# Patient Record
Sex: Female | Born: 1965 | Hispanic: Yes | Marital: Married | Smoking: Never smoker
Health system: Southern US, Community
[De-identification: ages and names within clinical notes are randomized; demographics above are authoritative.]

## PROBLEM LIST (undated history)

## (undated) DIAGNOSIS — I2542 Coronary artery dissection: Secondary | ICD-10-CM

## (undated) DIAGNOSIS — N920 Excessive and frequent menstruation with regular cycle: Secondary | ICD-10-CM

## (undated) DIAGNOSIS — N946 Dysmenorrhea, unspecified: Secondary | ICD-10-CM

## (undated) DIAGNOSIS — E785 Hyperlipidemia, unspecified: Secondary | ICD-10-CM

## (undated) DIAGNOSIS — I5189 Other ill-defined heart diseases: Secondary | ICD-10-CM

## (undated) DIAGNOSIS — E119 Type 2 diabetes mellitus without complications: Secondary | ICD-10-CM

## (undated) HISTORY — DX: Coronary artery dissection: I25.42

## (undated) HISTORY — DX: Other ill-defined heart diseases: I51.89

## (undated) HISTORY — DX: Hyperlipidemia, unspecified: E78.5

## (undated) HISTORY — PX: HERNIA REPAIR: SHX51

## (undated) HISTORY — PX: CHOLECYSTECTOMY: SHX55

## (undated) HISTORY — PX: ABDOMINAL HYSTERECTOMY: SHX81

## (undated) HISTORY — DX: Type 2 diabetes mellitus without complications: E11.9

---

## 2013-02-27 DIAGNOSIS — D649 Anemia, unspecified: Secondary | ICD-10-CM | POA: Insufficient documentation

## 2013-07-29 DIAGNOSIS — K2960 Other gastritis without bleeding: Secondary | ICD-10-CM | POA: Insufficient documentation

## 2014-04-13 ENCOUNTER — Observation Stay: Payer: Self-pay | Admitting: Surgery

## 2014-04-13 LAB — COMPREHENSIVE METABOLIC PANEL
Albumin: 3.6 g/dL (ref 3.4–5.0)
Alkaline Phosphatase: 95 U/L
Anion Gap: 9 (ref 7–16)
BUN: 10 mg/dL (ref 7–18)
Bilirubin,Total: 1 mg/dL (ref 0.2–1.0)
CALCIUM: 9 mg/dL (ref 8.5–10.1)
Chloride: 106 mmol/L (ref 98–107)
Co2: 22 mmol/L (ref 21–32)
Creatinine: 0.75 mg/dL (ref 0.60–1.30)
EGFR (Non-African Amer.): >60
GLUCOSE: 141 mg/dL — AB (ref 65–99)
OSMOLALITY: 275 (ref 275–301)
POTASSIUM: 3.3 mmol/L — AB (ref 3.5–5.1)
SGOT(AST): 65 U/L — ABNORMAL HIGH (ref 15–37)
SGPT (ALT): 41 U/L (ref 12–78)
Sodium: 137 mmol/L (ref 136–145)
Total Protein: 7.8 g/dL (ref 6.4–8.2)

## 2014-04-13 LAB — CBC WITH DIFFERENTIAL/PLATELET
BASOS ABS: 0 10*3/uL (ref 0.0–0.1)
Basophil %: 0.2 %
EOS PCT: 0.5 %
Eosinophil #: 0.1 10*3/uL (ref 0.0–0.7)
HCT: 44.3 % (ref 35.0–47.0)
HGB: 14.7 g/dL (ref 12.0–16.0)
LYMPHS ABS: 2.3 10*3/uL (ref 1.0–3.6)
Lymphocyte %: 13.8 %
MCH: 30 pg (ref 26.0–34.0)
MCHC: 33.1 g/dL (ref 32.0–36.0)
MCV: 91 fL (ref 80–100)
MONOS PCT: 6.9 %
Monocyte #: 1.2 x10 3/mm — ABNORMAL HIGH (ref 0.2–0.9)
NEUTROS PCT: 78.6 %
Neutrophil #: 13.3 10*3/uL — ABNORMAL HIGH (ref 1.4–6.5)
PLATELETS: 263 10*3/uL (ref 150–440)
RBC: 4.88 10*6/uL (ref 3.80–5.20)
RDW: 13.5 % (ref 11.5–14.5)
WBC: 16.9 10*3/uL — AB (ref 3.6–11.0)

## 2014-04-13 LAB — URINALYSIS, COMPLETE
Bacteria: NONE SEEN
Bilirubin,UR: NEGATIVE
Blood: NEGATIVE
Glucose,UR: NEGATIVE mg/dL (ref 0–75)
Ketone: NEGATIVE
Leukocyte Esterase: NEGATIVE
Nitrite: NEGATIVE
Ph: 6 (ref 4.5–8.0)
Protein: NEGATIVE
RBC,UR: 2 /HPF (ref 0–5)
SQUAMOUS EPITHELIAL: NONE SEEN
Specific Gravity: 1.012 (ref 1.003–1.030)

## 2014-04-13 LAB — LIPASE, BLOOD: LIPASE: 108 U/L (ref 73–393)

## 2014-04-14 LAB — CBC WITH DIFFERENTIAL/PLATELET
BASOS ABS: 0 10*3/uL (ref 0.0–0.1)
BASOS PCT: 0.3 %
EOS PCT: 2 %
Eosinophil #: 0.2 10*3/uL (ref 0.0–0.7)
HCT: 37.6 % (ref 35.0–47.0)
HGB: 12.3 g/dL (ref 12.0–16.0)
LYMPHS ABS: 2.2 10*3/uL (ref 1.0–3.6)
Lymphocyte %: 27 %
MCH: 30.1 pg (ref 26.0–34.0)
MCHC: 32.7 g/dL (ref 32.0–36.0)
MCV: 92 fL (ref 80–100)
Monocyte #: 0.7 x10 3/mm (ref 0.2–0.9)
Monocyte %: 8.5 %
NEUTROS PCT: 62.2 %
Neutrophil #: 5 10*3/uL (ref 1.4–6.5)
Platelet: 226 10*3/uL (ref 150–440)
RBC: 4.09 10*6/uL (ref 3.80–5.20)
RDW: 13.5 % (ref 11.5–14.5)
WBC: 8 10*3/uL (ref 3.6–11.0)

## 2014-04-14 LAB — COMPREHENSIVE METABOLIC PANEL
ALT: 221 U/L — AB (ref 12–78)
ANION GAP: 11 (ref 7–16)
Albumin: 2.8 g/dL — ABNORMAL LOW (ref 3.4–5.0)
Alkaline Phosphatase: 101 U/L
BUN: 10 mg/dL (ref 7–18)
Bilirubin,Total: 0.7 mg/dL (ref 0.2–1.0)
CALCIUM: 8.2 mg/dL — AB (ref 8.5–10.1)
CO2: 25 mmol/L (ref 21–32)
Chloride: 107 mmol/L (ref 98–107)
Creatinine: 0.75 mg/dL (ref 0.60–1.30)
GLUCOSE: 95 mg/dL (ref 65–99)
Osmolality: 284 (ref 275–301)
POTASSIUM: 3.5 mmol/L (ref 3.5–5.1)
SGOT(AST): 159 U/L — ABNORMAL HIGH (ref 15–37)
SODIUM: 143 mmol/L (ref 136–145)
Total Protein: 6.2 g/dL — ABNORMAL LOW (ref 6.4–8.2)

## 2014-04-16 LAB — PATHOLOGY REPORT

## 2014-06-16 ENCOUNTER — Ambulatory Visit: Payer: Self-pay | Admitting: Surgery

## 2014-06-16 LAB — LIPASE, BLOOD: LIPASE: 88 U/L (ref 73–393)

## 2014-06-16 LAB — HEPATIC FUNCTION PANEL A (ARMC)
AST: 10 U/L — AB (ref 15–37)
Albumin: 3.7 g/dL (ref 3.4–5.0)
Alkaline Phosphatase: 89 U/L
BILIRUBIN DIRECT: 0.1 mg/dL (ref 0.00–0.20)
Bilirubin,Total: 0.6 mg/dL (ref 0.2–1.0)
SGPT (ALT): 14 U/L
Total Protein: 7.6 g/dL (ref 6.4–8.2)

## 2014-07-12 ENCOUNTER — Emergency Department: Payer: Self-pay | Admitting: Emergency Medicine

## 2015-01-29 NOTE — H&P (Signed)
   Subjective/Chief Complaint RUQ pain, nausea/vomiting   History of Present Illness [name] is a pleasant 49 yo F with a history of hysterectomy who presents with 1 day of RUQ pain followed by nausea/vomiting.  Began acutely, comes in waves.  Better now after receiving pain meds.  Also with intermittent diarrhea.  Has had multiple episodes which did not last as long over the past 6 months.  No fevers/chills.   Past History S/p hysterectomy   Family and Social History:  Family History Coronary Artery Disease  Hypertension  Diabetes Mellitus   Social History negative tobacco   + Tobacco Current (within 1 year)   Place of Living Home   Review of Systems:  Subjective/Chief Complaint RUQ pain, nausea/vomiting   Fever/Chills No   Cough No   Sputum No   Abdominal Pain Yes   Diarrhea Yes   Constipation No   Nausea/Vomiting Yes   SOB/DOE No   Chest Pain No   Dysuria No   Tolerating PT No   Tolerating Diet Nauseated  Vomiting   Physical Exam:  GEN well developed, well nourished, no acute distress   HEENT pink conjunctivae   RESP normal resp effort  clear BS  no use of accessory muscles   CARD regular rate   ABD positive tenderness  denies Flank Tenderness  no hernia  distended  normal BS   SKIN normal to palpation, No rashes, No ulcers   NEURO cranial nerves intact, negative rigidity, negative tremor, follows commands   PSYCH A+O to time, place, person, good insight    Assessment/Admission Diagnosis 49 yo F with recurrent RUQ pain followed by nausea/vomiting.  + leukocytosis and thickened gb on ultrasound.  Clinical and radiographic cholecystitis   Plan Admit for resuscitation, IV pain meds, Abx.  Plan on lap chole in am.   Electronic Signatures: Jarvis NewcomerLundquist, Burnett Lieber A (MD)  (Signed 07-Jul-15 19:13)  Authored: CHIEF COMPLAINT and HISTORY, FAMILY AND SOCIAL HISTORY, REVIEW OF SYSTEMS, PHYSICAL EXAM, ASSESSMENT AND PLAN   Last Updated: 07-Jul-15 19:13  by Jarvis NewcomerLundquist, Takeila Thayne A (MD)

## 2015-01-29 NOTE — Op Note (Signed)
PATIENT NAME:  Elaine Fernandez, Elaine Fernandez MR#:  [id number] DATE OF BIRTH:  Jul 12, 1966  DATE OF PROCEDURE:  04/14/2014  SURGEON:  Ida Roguehristopher Aliha Diedrich, MD.  PREOPERATIVE DIAGNOSIS: Acute cholecystitis.   POSTOPERATIVE DIAGNOSIS: Acute cholecystitis.   PROCEDURE PERFORMED: Laparoscopic cholecystectomy.   ANESTHESIA: General.   ESTIMATED BLOOD LOSS: Minimal.   SPECIMENS: Gallbladder.   INDICATION FOR SURGERY:  Elaine Fernandez is a 49 year old female with recurrent right upper quadrant pain who presented with leukocytosis and gallbladder wall thickening concerning for cholecystitis. She was brought to the operating room for cholecystectomy.   DETAILS OF PROCEDURE:  Informed consent was obtained. Elaine Fernandez was brought to the operating room suite. She was induced, endotracheal tube was placed, general anesthesia was administered. Her abdomen was then prepped and draped in standard surgical fashion. A timeout was then performed correctly identifying the patient name, operative site and procedure to be performed.   A supraumbilical incision was made and deepened down to the fascia. The fascia was incised. The peritoneum was entered. Two stay sutures were placed through the fasciotomy. A Hasson trocar was placed through the fasciotomy. The abdomen was insufflated.  The gallbladder was examined. It was noted to be tight and an 11-mm epigastric and two 5-mm right subcostal trocars were placed at the midclavicular and anterior axillary lines. The gallbladder was aspirated with an ovarian cyst aspirator so that it could be grasped.  It was then lifted over the dome of the liver. The cystic artery and cystic duct were dissected out. The critical view was obtained. These structures were clipped and ligated. The gallbladder was then taken off the gallbladder fossa with cautery. The gallbladder was then removed in an Endo Catch bag through the supraumbilical trocar site.   The gallbladder fossa was  then cauterized for hemostasis. Once hemostasis was obtained the trocars were removed under direct visualization. The abdomen was desufflated. The previously-placed stay sutures were used to close the supraumbilical fasciotomy. The wounds were infiltrated with 1% lidocaine with epinephrine and were closed with interrupted 4-0 Monocryl deep dermal sutures. The patient was then awakened, extubated and brought to the postanesthesia care unit. There were no immediate complications. Needle, sponge, and instrument counts were correct at the end of the procedure.    ____________________________ Si Raiderhristopher A. Edan Juday, MD cal:lt D: 04/14/2014 10:44:27 ET T: 04/14/2014 13:47:15 ET JOB#: [id number]  cc: Cristal Deerhristopher A. Pearlie Lafosse, MD, <Dictator> Jarvis NewcomerHRISTOPHER A Jamecia Lerman MD ELECTRONICALLY SIGNED 04/18/2014 18:14

## 2017-08-13 ENCOUNTER — Emergency Department: Payer: Self-pay

## 2017-08-13 ENCOUNTER — Emergency Department
Admission: EM | Admit: 2017-08-13 | Discharge: 2017-08-13 | Disposition: A | Discharge status: Home / Self Care | Payer: Self-pay | Attending: Emergency Medicine | Admitting: Emergency Medicine

## 2017-08-13 ENCOUNTER — Encounter: Payer: Self-pay | Admitting: Emergency Medicine

## 2017-08-13 ENCOUNTER — Other Ambulatory Visit: Payer: Self-pay

## 2017-08-13 DIAGNOSIS — F4321 Adjustment disorder with depressed mood: Secondary | ICD-10-CM | POA: Insufficient documentation

## 2017-08-13 DIAGNOSIS — R079 Chest pain, unspecified: Secondary | ICD-10-CM | POA: Insufficient documentation

## 2017-08-13 LAB — CBC
HEMATOCRIT: 40.6 % (ref 35.0–47.0)
Hemoglobin: 14.2 g/dL (ref 12.0–16.0)
MCH: 32.4 pg (ref 26.0–34.0)
MCHC: 34.9 g/dL (ref 32.0–36.0)
MCV: 92.9 fL (ref 80.0–100.0)
PLATELETS: 249 10*3/uL (ref 150–440)
RBC: 4.36 MIL/uL (ref 3.80–5.20)
RDW: 12.7 % (ref 11.5–14.5)
WBC: 10.9 10*3/uL (ref 3.6–11.0)

## 2017-08-13 LAB — BASIC METABOLIC PANEL
Anion gap: 7 (ref 5–15)
BUN: 11 mg/dL (ref 6–20)
CHLORIDE: 105 mmol/L (ref 101–111)
CO2: 23 mmol/L (ref 22–32)
CREATININE: 0.69 mg/dL (ref 0.44–1.00)
Calcium: 9.3 mg/dL (ref 8.9–10.3)
Glucose, Bld: 134 mg/dL — ABNORMAL HIGH (ref 65–99)
POTASSIUM: 3.6 mmol/L (ref 3.5–5.1)
SODIUM: 135 mmol/L (ref 135–145)

## 2017-08-13 LAB — TROPONIN I: Troponin I: <0.03 ng/mL (ref <0.03)

## 2017-08-13 MED ORDER — LORAZEPAM 1 MG PO TABS: 1.0000 mg | ORAL_TABLET | Freq: Three times a day (TID) | ORAL | 0 refills | Status: DC | PRN

## 2017-08-13 MED ORDER — LORAZEPAM 1 MG PO TABS
1.0000 mg | ORAL_TABLET | Freq: Once | ORAL | Status: AC
  Administered 2017-08-13: 1 mg via ORAL
  Filled 2017-08-13: qty 1

## 2017-08-13 NOTE — ED Triage Notes (Signed)
Patient to ER for c/o chest pain that began this afternoon. Patient states she found out today that her mother passed away. States she felt mildly short of breath, as well as pain down left arm.

## 2017-08-13 NOTE — ED Provider Notes (Signed)
[facility] Emergency Department Provider Note   ____________________________________________   First MD Initiated Contact with Patient 08/13/17 0345     (approximate)  I have reviewed the triage vital signs and the nursing notes.   HISTORY  Chief Complaint Chest Pain    HPI Elaine Fernandez is a 51 y.o. female who comes into the hospital today with some chest pain. The patient heard some news yesterday morning that her mother passed away. Her mother was sick on Sunday and declined rapidly and past Monday morning. The patient states that her pain started around 3 PM. She had some pain in the middle of her chest that went down her left arm. The pain was much worse earlier but is improved right now. The patient had some initial shortness of breath and nausea. She reports that every time she tried to eat she's felt nauseous and hasn't eaten all day. The patient had some tingling in her leg on the left as well. The patient has not had any vomiting. She tried taking some Tylenol and ibuprofen which she typically takes for her knee but states that it didn't help. She received an injection in her knee last week for arthritis. The patient has just felt really shaky. She's had no sweats and no dizziness. She was concerned about the symptoms that she decided to come in and get checked out.   Past Medical History:  Diagnosis Date  . Hyperlipidemia     There are no active problems to display for this patient.   Past Surgical History:  Procedure Laterality Date  . ABDOMINAL HYSTERECTOMY    . CHOLECYSTECTOMY    . HERNIA REPAIR      Prior to Admission medications   Medication Sig Start Date End Date Taking? Authorizing Provider  LORazepam (ATIVAN) 1 MG tablet Take 1 tablet (1 mg total) every 8 (eight) hours as needed by mouth for anxiety. 08/13/17 08/13/18  Rebecka ApleyWebster, Allison P, MD    Allergies Patient has no known allergies.  No family history on  file.  Social History Social History   Tobacco Use  . Smoking status: Never Smoker  . Smokeless tobacco: Never Used  Substance Use Topics  . Alcohol use: No    Frequency: Never  . Drug use: Not on file    Review of Systems  Constitutional: Shaking Eyes: No visual changes. ENT: No sore throat. Cardiovascular:  chest pain. Respiratory: shortness of breath. Gastrointestinal: Nausea with No abdominal pain.  no vomiting.  No diarrhea.  No constipation. Genitourinary: Negative for dysuria. Musculoskeletal: Negative for back pain. Skin: Negative for rash. Neurological: Negative for headaches, focal weakness or numbness.   ____________________________________________   PHYSICAL EXAM:  VITAL SIGNS: ED Triage Vitals  Enc Vitals Group     BP 08/13/17 0121 139/83     Pulse Rate 08/13/17 0121 79     Resp 08/13/17 0121 20     Temp 08/13/17 0121 98.5 F (36.9 C)     Temp Source 08/13/17 0121 Oral     SpO2 08/13/17 0121 96 %     Weight 08/13/17 0120 193 lb (87.5 kg)     Height 08/13/17 0120 5\' 5"  (1.651 m)     Head Circumference --      Peak Flow --      Pain Score 08/13/17 0116 6     Pain Loc --      Pain Edu? --      Excl. in GC? --  Constitutional: Alert and oriented. Well appearing and in mild distress. Eyes: Conjunctivae are normal. PERRL. EOMI. Head: Atraumatic. Nose: No congestion/rhinnorhea. Mouth/Throat: Mucous membranes are moist.  Oropharynx non-erythematous. Cardiovascular: Normal rate, regular rhythm. Grossly normal heart sounds.  Good peripheral circulation. Respiratory: Normal respiratory effort.  No retractions. Lungs CTAB. Gastrointestinal: Soft and nontender. No distention. Positive bowel sounds Musculoskeletal: No lower extremity tenderness nor edema.   Neurologic:  Normal speech and language.  Skin:  Skin is warm, dry and intact.  Psychiatric: Mood and affect are normal.   ____________________________________________   LABS (all labs ordered  are listed, but only abnormal results are displayed)  Labs Reviewed  BASIC METABOLIC PANEL - Abnormal; Notable for the following components:      Result Value   Glucose, Bld 134 (*)    All other components within normal limits  CBC  TROPONIN I  TROPONIN I   ____________________________________________  EKG  ED ECG REPORT I, Rebecka ApleyWebster,  Allison P, the attending physician, personally viewed and interpreted this ECG.   Date: 08/13/2017  EKG Time: 0120  Rate: 80  Rhythm: normal sinus rhythm  Axis: normal  Intervals:none  ST&T Change: none  ____________________________________________  RADIOLOGY  Dg Chest 2 View  Result Date: 08/13/2017 CLINICAL DATA:  Chest pain EXAM: CHEST  2 VIEW COMPARISON:  None. FINDINGS: The heart size and mediastinal contours are within normal limits. Both lungs are clear. Surgical clips in the right upper quadrant. IMPRESSION: No active cardiopulmonary disease. Electronically Signed   By: Jasmine PangKim  Fujinaga M.D.   On: 08/13/2017 01:38    ____________________________________________   PROCEDURES  Procedure(s) performed: None  Procedures  Critical Care performed: No  ____________________________________________   INITIAL IMPRESSION / ASSESSMENT AND PLAN / ED COURSE  As part of my medical decision making, I reviewed the following data within the electronic MEDICAL RECORD NUMBER Notes from prior ED visits and [place] Controlled Substance Database   This is a 51 year old female who comes into the hospital today with some chest pain.  My differential diagnosis includes acute coronary syndrome, pneumonia, referred reaction and stress.  We did check some blood work on the patient. The patient's CBC BMP and troponin were all negative. We did repeat the troponin which was also negative. We did give the patient a dose of Ativan and her pain did improve. Her chest x-ray was also negative. I feel that the patient's pain is went to the grief losing her mother. I feel  that the patient to follow-up with cardiology as well as her primary care physician. She otherwise has no other concerns at this time. She'll be discharged home to follow-up.      ____________________________________________   FINAL CLINICAL IMPRESSION(S) / ED DIAGNOSES  Final diagnoses:  Chest pain, unspecified type  Grief      Note:  This document was prepared using Dragon voice recognition software and may include unintentional dictation errors.    Rebecka ApleyWebster, Allison P, MD 08/13/17 [phone]

## 2018-08-05 ENCOUNTER — Inpatient Hospital Stay
Admission: EM | Admit: 2018-08-05 | Discharge: 2018-08-07 | DRG: 280 | Disposition: A | Discharge status: Home / Self Care | Payer: Medicaid Other | Attending: Internal Medicine | Admitting: Internal Medicine

## 2018-08-05 ENCOUNTER — Emergency Department: Payer: Medicaid Other

## 2018-08-05 ENCOUNTER — Other Ambulatory Visit: Payer: Self-pay

## 2018-08-05 ENCOUNTER — Encounter: Payer: Self-pay | Admitting: Emergency Medicine

## 2018-08-05 DIAGNOSIS — E785 Hyperlipidemia, unspecified: Secondary | ICD-10-CM | POA: Diagnosis present

## 2018-08-05 DIAGNOSIS — R079 Chest pain, unspecified: Secondary | ICD-10-CM | POA: Diagnosis present

## 2018-08-05 DIAGNOSIS — E7800 Pure hypercholesterolemia, unspecified: Secondary | ICD-10-CM | POA: Diagnosis present

## 2018-08-05 DIAGNOSIS — Z90710 Acquired absence of both cervix and uterus: Secondary | ICD-10-CM

## 2018-08-05 DIAGNOSIS — I214 Non-ST elevation (NSTEMI) myocardial infarction: Secondary | ICD-10-CM | POA: Diagnosis present

## 2018-08-05 DIAGNOSIS — I2542 Coronary artery dissection: Secondary | ICD-10-CM | POA: Diagnosis present

## 2018-08-05 DIAGNOSIS — Z8249 Family history of ischemic heart disease and other diseases of the circulatory system: Secondary | ICD-10-CM | POA: Diagnosis not present

## 2018-08-05 DIAGNOSIS — I10 Essential (primary) hypertension: Secondary | ICD-10-CM | POA: Diagnosis present

## 2018-08-05 DIAGNOSIS — R0789 Other chest pain: Secondary | ICD-10-CM | POA: Diagnosis present

## 2018-08-05 HISTORY — DX: Excessive and frequent menstruation with regular cycle: N92.0

## 2018-08-05 HISTORY — DX: Dysmenorrhea, unspecified: N94.6

## 2018-08-05 LAB — CK: Total CK: 78 U/L (ref 38–234)

## 2018-08-05 LAB — CBC
HCT: 43.4 % (ref 36.0–46.0)
Hemoglobin: 14.5 g/dL (ref 12.0–15.0)
MCH: 31.6 pg (ref 26.0–34.0)
MCHC: 33.4 g/dL (ref 30.0–36.0)
MCV: 94.6 fL (ref 80.0–100.0)
NRBC: 0 % (ref 0.0–0.2)
Platelets: 261 10*3/uL (ref 150–400)
RBC: 4.59 MIL/uL (ref 3.87–5.11)
RDW: 11.9 % (ref 11.5–15.5)
WBC: 7.3 10*3/uL (ref 4.0–10.5)

## 2018-08-05 LAB — BASIC METABOLIC PANEL
Anion gap: 9 (ref 5–15)
BUN: 12 mg/dL (ref 6–20)
CHLORIDE: 106 mmol/L (ref 98–111)
CO2: 25 mmol/L (ref 22–32)
CREATININE: 0.75 mg/dL (ref 0.44–1.00)
Calcium: 9.3 mg/dL (ref 8.9–10.3)
GFR calc non Af Amer: >60 mL/min (ref >60)
GLUCOSE: 116 mg/dL — AB (ref 70–99)
Potassium: 3.7 mmol/L (ref 3.5–5.1)
Sodium: 140 mmol/L (ref 135–145)

## 2018-08-05 LAB — TROPONIN I
TROPONIN I: 0.21 ng/mL — AB (ref <0.03)
Troponin I: 0.04 ng/mL (ref <0.03)

## 2018-08-05 MED ORDER — ASPIRIN 81 MG PO CHEW
324.0000 mg | CHEWABLE_TABLET | Freq: Once | ORAL | Status: AC
  Administered 2018-08-05: 324 mg via ORAL
  Filled 2018-08-05: qty 4

## 2018-08-05 MED ORDER — ACETAMINOPHEN 650 MG RE SUPP: 650.0000 mg | Freq: Four times a day (QID) | RECTAL | Status: DC | PRN

## 2018-08-05 MED ORDER — ASPIRIN 325 MG PO TABS
325.0000 mg | ORAL_TABLET | Freq: Every day | ORAL | Status: DC
  Filled 2018-08-05: qty 1

## 2018-08-05 MED ORDER — HEPARIN SODIUM (PORCINE) 5000 UNIT/ML IJ SOLN: 5000.0000 [IU] | Freq: Three times a day (TID) | INTRAMUSCULAR | Status: DC

## 2018-08-05 MED ORDER — TRAZODONE HCL 50 MG PO TABS: 25.0000 mg | ORAL_TABLET | Freq: Every evening | ORAL | Status: DC | PRN

## 2018-08-05 MED ORDER — HYDROCODONE-ACETAMINOPHEN 5-325 MG PO TABS
1.0000 | ORAL_TABLET | ORAL | Status: DC | PRN
  Administered 2018-08-05: 1 via ORAL
  Filled 2018-08-05: qty 1

## 2018-08-05 MED ORDER — ACETAMINOPHEN 325 MG PO TABS
650.0000 mg | ORAL_TABLET | Freq: Four times a day (QID) | ORAL | Status: DC | PRN
  Administered 2018-08-06: 650 mg via ORAL

## 2018-08-05 MED ORDER — ONDANSETRON HCL 4 MG/2ML IJ SOLN: 4.0000 mg | Freq: Four times a day (QID) | INTRAMUSCULAR | Status: DC | PRN

## 2018-08-05 MED ORDER — LORAZEPAM 2 MG/ML IJ SOLN: 1.0000 mg | Freq: Every evening | INTRAMUSCULAR | Status: DC | PRN

## 2018-08-05 MED ORDER — SODIUM CHLORIDE 0.9 % IV SOLN
Freq: Once | INTRAVENOUS | Status: AC
  Administered 2018-08-05: via INTRAVENOUS

## 2018-08-05 MED ORDER — BISACODYL 5 MG PO TBEC: 5.0000 mg | DELAYED_RELEASE_TABLET | Freq: Every day | ORAL | Status: DC | PRN

## 2018-08-05 MED ORDER — DOCUSATE SODIUM 100 MG PO CAPS
100.0000 mg | ORAL_CAPSULE | Freq: Two times a day (BID) | ORAL | Status: DC
  Administered 2018-08-06 – 2018-08-07 (×2): 100 mg via ORAL
  Filled 2018-08-05 (×2): qty 1

## 2018-08-05 MED ORDER — ONDANSETRON HCL 4 MG PO TABS: 4.0000 mg | ORAL_TABLET | Freq: Four times a day (QID) | ORAL | Status: DC | PRN

## 2018-08-05 MED ORDER — NITROGLYCERIN 0.4 MG SL SUBL
0.4000 mg | SUBLINGUAL_TABLET | SUBLINGUAL | Status: DC | PRN
  Administered 2018-08-05: 0.4 mg via SUBLINGUAL
  Filled 2018-08-05: qty 1

## 2018-08-05 NOTE — ED Notes (Signed)
Scanner broken in room 16.  IT was called earlier and still not fixed scanner.  meds given to pt.  Interpreter with pt   Family with pt.

## 2018-08-05 NOTE — ED Notes (Signed)
Report off to kala rn 

## 2018-08-05 NOTE — ED Provider Notes (Addendum)
Patient seen by the hospitalist, Dr. Tildon Husky.  Dr. Tildon Husky reports that she is able to easily re-elicit the patient's pain by palpation across the chest since been associated with moving heavy boxes.  Present time the patient does report that her pain is improved and resting comfortably with pain-free status.  Hospitalist service and recommends rechecking a troponin, if no elevation they would advise discharge and follow-up with cardiology tomorrow and would not admit unless troponin elevates.   Ongoing care assigned to Dr. Cyril Loosen, follow-up on repeat troponin and CK testing, patient remains pain-free without elevation of troponin on recheck would consider discharge for outpatient follow-up tomorrow with cardiology.   Sharyn Creamer, MD 08/05/18 2027    Trop now 0.21, discussed with Dr. Caryn Bee, admission requested by me to hospital for further work-up.     Sharyn Creamer, MD 08/05/18 2141
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
## 2018-08-05 NOTE — ED Notes (Signed)
Date and time results received: 08/05/18 2250 (use smartphrase ".now" to insert current time)  Test: Troponin Critical Value: 0.21  Name of Provider Notified Dr. Fanny Bien  Orders Received? Or Actions Taken?:
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
## 2018-08-05 NOTE — ED Notes (Signed)
Date and time results received: 08/05/18 5:52 PM  (use smartphrase ".now" to insert current time)  Test: troponin 0.04 Critical Value: Name of Provider Notified: Dr. Mayford Knife

## 2018-08-05 NOTE — ED Notes (Signed)
Report to Amy, RN
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
## 2018-08-05 NOTE — H&P (Addendum)
[facility] [place] [facility]   PATIENT NAME: Elaine Fernandez    MR#:  [id number]  DATE OF BIRTH:  1966-04-26  DATE OF ADMISSION:  08/05/2018  PRIMARY CARE PHYSICIAN: [facility]   REQUESTING/REFERRING PHYSICIAN:   CHIEF COMPLAINT:   Chief Complaint  Patient presents with  . Hypertension    HISTORY OF PRESENT ILLNESS: Elaine Fernandez  is a 52 y.o. female without significant past medical history. She presented to emergency room for acute onset of chest pain episode, started around 2 PM, while at rest. She describes the pain as  retrosternal ache, 5 out of 10 in severity, reproducible and radiating to the left arm.  Patient has been moving heavy boxes in the past several days due to moving to a new residence.  Her chest pain started right after she received in use about her dog being involved in an accident.  No similar episodes in the past.  No fever, no chills, no cough, no palpitations, no nausea/vomiting, no bleeding.  She does not take any medications and she denies smoking or using alcohol or illicit drugs. Chest pain is currently resolved, status post nitroglycerin, received in the emergency room. Patient has been physically active in the past 6 months, she does take power walks at least 3 times a week in the park, without any chest pain.  She has changed her diet to more veggies and fruits rather than meat products and fatty foods. With these changes, she was able to lose 30 pounds in the past 6 months. Blood test done emergency room, including CBC and CMP are grossly unremarkable.  First troponin level was borderline elevated at 0.04 and second troponin level went up to 0.21. EKG shows normal sinus rhythm with heart rate of 62 bpm, no acute ischemic changes. Chest x-ray is unremarkable. She is admitted for further evaluation and treatment.  PAST MEDICAL HISTORY:  History reviewed. No pertinent past medical  history.  PAST SURGICAL HISTORY:  Past Surgical History:  Procedure Laterality Date  . ABDOMINAL HYSTERECTOMY    . CHOLECYSTECTOMY    . HERNIA REPAIR      SOCIAL HISTORY:  Social History   Tobacco Use  . Smoking status: Never Smoker  . Smokeless tobacco: Never Used  Substance Use Topics  . Alcohol use: No    Frequency: Never    FAMILY HISTORY: Coronary artery disease in mother, diagnosed at age 18.  DRUG ALLERGIES: No Known Allergies  REVIEW OF SYSTEMS:   CONSTITUTIONAL: No fever, fatigue or weakness.  EYES: No changes in vision.  EARS, NOSE, AND THROAT: No tinnitus or ear pain.  RESPIRATORY: No cough, shortness of breath, wheezing or hemoptysis.  CARDIOVASCULAR: Positive for chest pain, no orthopnea, edema.  GASTROINTESTINAL: No nausea, vomiting, diarrhea or abdominal pain.  GENITOURINARY: No dysuria, hematuria.  ENDOCRINE: No polyuria, nocturia. HEMATOLOGY: No bleeding. SKIN: No rash or lesion. MUSCULOSKELETAL: No joint pain at this time.   NEUROLOGIC: No focal weakness.  PSYCHIATRY: No anxiety or depression.   MEDICATIONS AT HOME:  Prior to Admission medications   Medication Sig Start Date End Date Taking? Authorizing Provider  ibuprofen (ADVIL,MOTRIN) 200 MG tablet Take 400-800 mg by mouth every 6 (six) hours as needed for pain.   Yes [provider]      PHYSICAL EXAMINATION:   VITAL SIGNS: Blood pressure (!) 144/84, pulse (!) 57, temperature 98.6 F (37 C), temperature source Oral, resp. rate 14, height 5\' 5"  (1.651 m),  weight 76.2 kg, SpO2 99 %.  GENERAL:  52 y.o.-year-old patient lying in the bed with no acute distress.  EYES: Pupils equal, round, reactive to light and accommodation. No scleral icterus. Extraocular muscles intact.  HEENT: Head atraumatic, normocephalic. Oropharynx and nasopharynx clear.  NECK:  Supple, no jugular venous distention. No thyroid enlargement, no tenderness.  LUNGS: Normal breath sounds bilaterally, no wheezing,  rales,rhonchi or crepitation. No use of accessory muscles of respiration.  CARDIOVASCULAR: S1, S2 normal. No S3/S4.  ABDOMEN: Soft, nontender, nondistended. Bowel sounds present. No organomegaly or mass.  EXTREMITIES: No pedal edema, cyanosis, or clubbing.  NEUROLOGIC: Cranial nerves II through XII are intact. Muscle strength 5/5 in all extremities. Sensation intact.   PSYCHIATRIC: The patient is alert and oriented x 3.  SKIN: No obvious rash, lesion, or ulcer.   LABORATORY PANEL:   CBC Recent Labs  Lab 08/05/18 1653  WBC 7.3  HGB 14.5  HCT 43.4  PLT 261  MCV 94.6  MCH 31.6  MCHC 33.4  RDW 11.9   ------------------------------------------------------------------------------------------------------------------  Chemistries  Recent Labs  Lab 08/05/18 1653  NA 140  K 3.7  CL 106  CO2 25  GLUCOSE 116*  BUN 12  CREATININE 0.75  CALCIUM 9.3   ------------------------------------------------------------------------------------------------------------------ estimated creatinine clearance is 85.9 mL/min (by C-G formula based on SCr of 0.75 mg/dL). ------------------------------------------------------------------------------------------------------------------ No results for input(s): TSH, T4TOTAL, T3FREE, THYROIDAB in the last 72 hours.  Invalid input(s): FREET3   Coagulation profile No results for input(s): INR, PROTIME in the last 168 hours. ------------------------------------------------------------------------------------------------------------------- No results for input(s): DDIMER in the last 72 hours. -------------------------------------------------------------------------------------------------------------------  Cardiac Enzymes Recent Labs  Lab 08/05/18 1653 08/05/18 2042  TROPONINI 0.04* 0.21*   ------------------------------------------------------------------------------------------------------------------ Invalid input(s):  POCBNP  ---------------------------------------------------------------------------------------------------------------  Urinalysis    Component Value Date/Time   COLORURINE Yellow 04/13/2014 1900   APPEARANCEUR Clear 04/13/2014 1900   LABSPEC 1.012 04/13/2014 1900   PHURINE 6.0 04/13/2014 1900   GLUCOSEU Negative 04/13/2014 1900   HGBUR Negative 04/13/2014 1900   BILIRUBINUR Negative 04/13/2014 1900   KETONESUR Negative 04/13/2014 1900   PROTEINUR Negative 04/13/2014 1900   NITRITE Negative 04/13/2014 1900   LEUKOCYTESUR Negative 04/13/2014 1900     RADIOLOGY: Dg Chest 2 View  Result Date: 08/05/2018 CLINICAL DATA:  Chest pain, hypertension EXAM: CHEST - 2 VIEW COMPARISON:  Chest x-ray of 08/13/2017 FINDINGS: No active infiltrate or effusion is seen. Mediastinal and hilar contours are unremarkable. The heart is within upper limits of normal. No bony abnormality is seen. IMPRESSION: No active cardiopulmonary disease. Electronically Signed   By: Dwyane Dee M.D.   On: 08/05/2018 17:17    EKG: Orders placed or performed during the hospital encounter of 08/05/18  . ED EKG within 10 minutes  . ED EKG within 10 minutes  . ED EKG  . ED EKG    IMPRESSION AND PLAN:  1.  NSTEMI, will rule out ACS.  Continue aspirin daily and nitroglycerin as needed.  Continue to monitor patient on telemetry and follow troponin levels.  Will check fasting lipid panel, TSH and hemoglobin A1c.  Will check 2D echo to further evaluate cardiac function.  Cardiology is consulted for further evaluation and treatment. 2.  Chest pain, rather atypical from patient's description.  However, will rule out ACS.  All the records are reviewed and case discussed with ED provider. Management plans discussed with the patient, family and they are in agreement.  CODE STATUS: Full    TOTAL TIME TAKING CARE OF THIS  PATIENT: 45 minutes.    Cammy Copa M.D on 08/05/2018 at 10:38 PM  Between 7am to 6pm - Pager -  [phone]  After 6pm go to [email or web address] - password [email or web address] [place] at [facility]  [phone]  CC: Primary care physician; [facility] [employer]

## 2018-08-05 NOTE — Consult Note (Signed)
[facility] [place] at [facility]   PATIENT NAME: Elaine Fernandez    MR#:  [id number]  DATE OF BIRTH:  07-24-1966  DATE OF ADMISSION:  08/05/2018  PRIMARY CARE PHYSICIAN: [facility]   REQUESTING/REFERRING PHYSICIAN: dr Fanny Bien  CHIEF COMPLAINT:   Chest pain HISTORY OF PRESENT ILLNESS:  Elaine Fernandez  is a 52 y.o. female with no past medical history who presents today due to chest pain.  Patient reports approximately 2:00 this afternoon while she was sitting at her house she developed sudden onset of chest pain.  The chest pain radiated to her arm.  Did not have nausea, diaphoresis, palpitations with the chest pain.  On a scale of 1-10 it was a 5 out of 10.  The chest pain has persisted.  Patient has been moving heavy boxes over the past several days because she is moving out of her current house. When she had chest pain she just received news that her dog had been an accident.  She does report that she has had some muscle tightness of her chest since she has been moving.  She also states that her arm has been falling asleep recently when she lays on it.  In the ER her troponin 0 0.04 and EKG shows no acute changes.  On examination patient demonstrates significant chest wall tenderness which is similar to the current pain she is having.  PAST MEDICAL HISTORY:  She has had elevated blood pressure in the past but no formal diagnosis of hypertension  PAST SURGICAL HISTORY:   Past Surgical History:  Procedure Laterality Date  . ABDOMINAL HYSTERECTOMY    . CHOLECYSTECTOMY    . HERNIA REPAIR      SOCIAL HISTORY:   Social History   Tobacco Use  . Smoking status: Never Smoker  . Smokeless tobacco: Never Used  Substance Use Topics  . Alcohol use: No    Frequency: Never    FAMILY HISTORY:  CAD Hypertension  DRUG ALLERGIES:  No Known Allergies  REVIEW OF SYSTEMS:   Review of Systems  Constitutional: Negative.  Negative for  chills, fever and malaise/fatigue.  HENT: Negative.  Negative for ear discharge, ear pain, hearing loss, nosebleeds and sore throat.   Eyes: Negative.  Negative for blurred vision and pain.  Respiratory: Negative.  Negative for cough, hemoptysis, shortness of breath and wheezing.   Cardiovascular: Negative.  Negative for chest pain, palpitations and leg swelling.  Gastrointestinal: Negative.  Negative for abdominal pain, blood in stool, diarrhea, nausea and vomiting.  Genitourinary: Negative.  Negative for dysuria.  Musculoskeletal: Negative.  Negative for back pain.  Skin: Negative.   Neurological: Negative for dizziness, tremors, speech change, focal weakness, seizures and headaches.  Endo/Heme/Allergies: Negative.  Does not bruise/bleed easily.  Psychiatric/Behavioral: Negative.  Negative for depression, hallucinations and suicidal ideas.    MEDICATIONS AT HOME:   None  VITAL SIGNS:  Blood pressure (!) 144/84, pulse 64, temperature 98.6 F (37 C), temperature source Oral, resp. rate 15, height 5\' 5"  (1.651 m), weight 76.2 kg, SpO2 99 %.  PHYSICAL EXAMINATION:   Physical Exam  Constitutional: She is oriented to person, place, and time. No distress.  HENT:  Head: Normocephalic.  Eyes: No scleral icterus.  Neck: Normal range of motion. Neck supple. No JVD present. No tracheal deviation present.  Cardiovascular: Normal rate, regular rhythm and normal heart sounds. Exam reveals no gallop and no friction rub.  No murmur heard. Significant chest wall tenderness which  reproduces current pain  Pulmonary/Chest: Effort normal and breath sounds normal. No respiratory distress. She has no wheezes. She has no rales. She exhibits no tenderness.  Abdominal: Soft. Bowel sounds are normal. She exhibits no distension and no mass. There is no tenderness. There is no rebound and no guarding.  Musculoskeletal: Normal range of motion. She exhibits no edema.  Neurological: She is alert and oriented to  person, place, and time.  Skin: Skin is warm. No rash noted. No erythema.  Psychiatric: Judgment normal.      LABORATORY PANEL:   CBC Recent Labs  Lab 08/05/18 1653  WBC 7.3  HGB 14.5  HCT 43.4  PLT 261   ------------------------------------------------------------------------------------------------------------------  Chemistries  Recent Labs  Lab 08/05/18 1653  NA 140  K 3.7  CL 106  CO2 25  GLUCOSE 116*  BUN 12  CREATININE 0.75  CALCIUM 9.3   ------------------------------------------------------------------------------------------------------------------  Cardiac Enzymes Recent Labs  Lab 08/05/18 1653  TROPONINI 0.04*   ------------------------------------------------------------------------------------------------------------------  RADIOLOGY:  Dg Chest 2 View  Result Date: 08/05/2018 CLINICAL DATA:  Chest pain, hypertension EXAM: CHEST - 2 VIEW COMPARISON:  Chest x-ray of 08/13/2017 FINDINGS: No active infiltrate or effusion is seen. Mediastinal and hilar contours are unremarkable. The heart is within upper limits of normal. No bony abnormality is seen. IMPRESSION: No active cardiopulmonary disease. Electronically Signed   By: Dwyane Dee M.D.   On: 08/05/2018 17:17    EKG:  Sinus rhythm no ST elevation or depression  IMPRESSION AND PLAN:   52 year old female with no past medical history presents with chest pain.  1.  Atypical reproducible chest pain: Plan to check another cardiac enzyme and if this is normal then ED physician will speak with cardiologist on call and arrange outpatient work-up. Her chest pain is more musculoskeletal in nature and she would benefit from some ibuprofen or NSAIDs.   Thank you for allowing me to participate in the care of the patient. All the records are reviewed and case discussed with ED provider. Management plans discussed with the patient and she is in agreement  CODE STATUS: Full  TOTAL TIME TAKING CARE OF  THIS PATIENT: 45 minutes.    Mitsugi Schrader M.D on 08/05/2018 at 8:25 PM  Between 7am to 6pm - Pager - [phone]  After 6pm go to [email or web address] - Social research officer, government  [facility]  Office  [phone]  CC: Primary care physician; [facility] [employer]

## 2018-08-05 NOTE — ED Notes (Signed)
Charge RN, Research scientist (physical sciences) informed that patient is needing to be seen on main part of ER per Dr. Mayford Knife.

## 2018-08-05 NOTE — ED Notes (Signed)
Kala RN, aware of bed assigned  

## 2018-08-06 ENCOUNTER — Encounter
Admission: EM | Disposition: A | Discharge status: Home / Self Care | Payer: Self-pay | Source: Home / Self Care | Attending: Internal Medicine

## 2018-08-06 ENCOUNTER — Encounter: Payer: Self-pay | Admitting: Physician Assistant

## 2018-08-06 ENCOUNTER — Inpatient Hospital Stay (HOSPITAL_COMMUNITY)
Admit: 2018-08-06 | Discharge: 2018-08-06 | Disposition: A | Discharge status: Home / Self Care | Payer: Medicaid Other | Attending: Internal Medicine | Admitting: Internal Medicine

## 2018-08-06 ENCOUNTER — Other Ambulatory Visit: Payer: Self-pay

## 2018-08-06 DIAGNOSIS — I214 Non-ST elevation (NSTEMI) myocardial infarction: Principal | ICD-10-CM

## 2018-08-06 DIAGNOSIS — I5030 Unspecified diastolic (congestive) heart failure: Secondary | ICD-10-CM

## 2018-08-06 HISTORY — PX: LEFT HEART CATH AND CORONARY ANGIOGRAPHY: CATH118249

## 2018-08-06 LAB — CBC
HEMATOCRIT: 39.7 % (ref 36.0–46.0)
Hemoglobin: 13.2 g/dL (ref 12.0–15.0)
MCH: 31.6 pg (ref 26.0–34.0)
MCHC: 33.2 g/dL (ref 30.0–36.0)
MCV: 95 fL (ref 80.0–100.0)
Platelets: 229 10*3/uL (ref 150–400)
RBC: 4.18 MIL/uL (ref 3.87–5.11)
RDW: 11.9 % (ref 11.5–15.5)
WBC: 6.6 10*3/uL (ref 4.0–10.5)
nRBC: 0 % (ref 0.0–0.2)

## 2018-08-06 LAB — LIPID PANEL
CHOL/HDL RATIO: 4.6 ratio
Cholesterol: 185 mg/dL (ref 0–200)
HDL: 40 mg/dL — AB (ref >40)
LDL CALC: 118 mg/dL — AB (ref 0–99)
Triglycerides: 133 mg/dL (ref <150)
VLDL: 27 mg/dL (ref 0–40)

## 2018-08-06 LAB — BASIC METABOLIC PANEL
Anion gap: 8 (ref 5–15)
BUN: 13 mg/dL (ref 6–20)
CHLORIDE: 108 mmol/L (ref 98–111)
CO2: 25 mmol/L (ref 22–32)
CREATININE: 0.63 mg/dL (ref 0.44–1.00)
Calcium: 8.7 mg/dL — ABNORMAL LOW (ref 8.9–10.3)
GFR calc Af Amer: >60 mL/min (ref >60)
GFR calc non Af Amer: >60 mL/min (ref >60)
Glucose, Bld: 102 mg/dL — ABNORMAL HIGH (ref 70–99)
Potassium: 3.7 mmol/L (ref 3.5–5.1)
Sodium: 141 mmol/L (ref 135–145)

## 2018-08-06 LAB — MAGNESIUM: MAGNESIUM: 2.1 mg/dL (ref 1.7–2.4)

## 2018-08-06 LAB — ECHOCARDIOGRAM COMPLETE
Height: 65 in
Weight: 2687.85 oz

## 2018-08-06 LAB — TROPONIN I: Troponin I: 0.22 ng/mL (ref <0.03)

## 2018-08-06 LAB — GLUCOSE, CAPILLARY: GLUCOSE-CAPILLARY: 91 mg/dL (ref 70–99)

## 2018-08-06 LAB — HEMOGLOBIN A1C
HEMOGLOBIN A1C: 5.2 % (ref 4.8–5.6)
MEAN PLASMA GLUCOSE: 102.54 mg/dL

## 2018-08-06 LAB — TSH: TSH: 1.309 u[IU]/mL (ref 0.350–4.500)

## 2018-08-06 SURGERY — LEFT HEART CATH AND CORONARY ANGIOGRAPHY
Anesthesia: Moderate Sedation

## 2018-08-06 MED ORDER — VERAPAMIL HCL 2.5 MG/ML IV SOLN
INTRAVENOUS | Status: DC | PRN
  Administered 2018-08-06 (×2): 2.5 mg via INTRA_ARTERIAL

## 2018-08-06 MED ORDER — HEPARIN SODIUM (PORCINE) 1000 UNIT/ML IJ SOLN
INTRAMUSCULAR | Status: DC | PRN
  Administered 2018-08-06: 4000 [IU] via INTRAVENOUS

## 2018-08-06 MED ORDER — FENTANYL CITRATE (PF) 100 MCG/2ML IJ SOLN
INTRAMUSCULAR | Status: DC | PRN
  Administered 2018-08-06 (×2): 25 ug via INTRAVENOUS

## 2018-08-06 MED ORDER — ASPIRIN 81 MG PO CHEW
CHEWABLE_TABLET | ORAL | Status: AC
  Filled 2018-08-06: qty 1

## 2018-08-06 MED ORDER — SODIUM CHLORIDE 0.9 % IV SOLN: 250.0000 mL | INTRAVENOUS | Status: DC | PRN

## 2018-08-06 MED ORDER — ASPIRIN 81 MG PO CHEW
81.0000 mg | CHEWABLE_TABLET | ORAL | Status: AC
  Administered 2018-08-06: 81 mg via ORAL

## 2018-08-06 MED ORDER — MIDAZOLAM HCL 2 MG/2ML IJ SOLN
INTRAMUSCULAR | Status: DC | PRN
  Administered 2018-08-06: 1 mg via INTRAVENOUS

## 2018-08-06 MED ORDER — HEPARIN (PORCINE) IN NACL 1000-0.9 UT/500ML-% IV SOLN
INTRAVENOUS | Status: AC
  Filled 2018-08-06: qty 1000

## 2018-08-06 MED ORDER — SODIUM CHLORIDE 0.9 % WEIGHT BASED INFUSION
3.0000 mL/kg/h | INTRAVENOUS | Status: DC
  Administered 2018-08-06: 3 mL/kg/h via INTRAVENOUS

## 2018-08-06 MED ORDER — SODIUM CHLORIDE 0.9% FLUSH: 3.0000 mL | Freq: Two times a day (BID) | INTRAVENOUS | Status: DC

## 2018-08-06 MED ORDER — MIDAZOLAM HCL 2 MG/2ML IJ SOLN
INTRAMUSCULAR | Status: AC
  Filled 2018-08-06: qty 2

## 2018-08-06 MED ORDER — SODIUM CHLORIDE 0.9 % WEIGHT BASED INFUSION: 1.0000 mL/kg/h | INTRAVENOUS | Status: DC

## 2018-08-06 MED ORDER — FENTANYL CITRATE (PF) 100 MCG/2ML IJ SOLN
INTRAMUSCULAR | Status: AC
  Filled 2018-08-06: qty 2

## 2018-08-06 MED ORDER — HEPARIN SODIUM (PORCINE) 1000 UNIT/ML IJ SOLN
INTRAMUSCULAR | Status: AC
  Filled 2018-08-06: qty 1

## 2018-08-06 MED ORDER — NITROGLYCERIN 5 MG/ML IV SOLN
INTRAVENOUS | Status: AC
  Filled 2018-08-06: qty 10

## 2018-08-06 MED ORDER — ACETAMINOPHEN 325 MG PO TABS
ORAL_TABLET | ORAL | Status: AC
  Filled 2018-08-06: qty 2

## 2018-08-06 MED ORDER — ASPIRIN 81 MG PO CHEW
81.0000 mg | CHEWABLE_TABLET | Freq: Every day | ORAL | Status: DC
  Administered 2018-08-07: 81 mg via ORAL
  Filled 2018-08-06: qty 1

## 2018-08-06 MED ORDER — SODIUM CHLORIDE 0.9% FLUSH: 3.0000 mL | INTRAVENOUS | Status: DC | PRN

## 2018-08-06 MED ORDER — ATORVASTATIN CALCIUM 20 MG PO TABS
40.0000 mg | ORAL_TABLET | Freq: Every day | ORAL | Status: DC
  Administered 2018-08-06: 40 mg via ORAL
  Filled 2018-08-06: qty 2

## 2018-08-06 MED ORDER — VERAPAMIL HCL 2.5 MG/ML IV SOLN
INTRAVENOUS | Status: AC
  Filled 2018-08-06: qty 2

## 2018-08-06 MED ORDER — IOPAMIDOL (ISOVUE-300) INJECTION 61%
INTRAVENOUS | Status: DC | PRN
  Administered 2018-08-06: 60 mL via INTRA_ARTERIAL

## 2018-08-06 MED ORDER — SODIUM CHLORIDE 0.9% FLUSH
3.0000 mL | Freq: Two times a day (BID) | INTRAVENOUS | Status: DC
  Administered 2018-08-06: 3 mL via INTRAVENOUS

## 2018-08-06 MED ORDER — SODIUM CHLORIDE 0.9 % IV SOLN
INTRAVENOUS | Status: AC
  Administered 2018-08-06: 13:00:00 via INTRAVENOUS

## 2018-08-06 MED ORDER — NITROGLYCERIN 1 MG/10 ML FOR IR/CATH LAB
INTRA_ARTERIAL | Status: DC | PRN
  Administered 2018-08-06: 200 ug via INTRACORONARY

## 2018-08-06 MED ORDER — METOPROLOL TARTRATE 25 MG PO TABS
12.5000 mg | ORAL_TABLET | Freq: Two times a day (BID) | ORAL | Status: DC
  Administered 2018-08-06 – 2018-08-07 (×3): 12.5 mg via ORAL
  Filled 2018-08-06 (×3): qty 1

## 2018-08-06 SURGICAL SUPPLY — 7 items
CATH INFINITI 5FR ANG PIGTAIL (CATHETERS) ×3 IMPLANT
CATH INFINITI 5FR JK (CATHETERS) ×3 IMPLANT
DEVICE RAD TR BAND REGULAR (VASCULAR PRODUCTS) ×3 IMPLANT
GLIDESHEATH SLEND SS 6F .021 (SHEATH) ×3 IMPLANT
KIT MANI 3VAL PERCEP (MISCELLANEOUS) ×3 IMPLANT
PACK CARDIAC CATH (CUSTOM PROCEDURE TRAY) ×3 IMPLANT
WIRE ROSEN-J .035X260CM (WIRE) ×3 IMPLANT

## 2018-08-06 NOTE — Consult Note (Signed)
   Cardiology Consultation:   Patient ID: Elaine Fernandez; [id number]; 12/30/1965   Admit date: 08/05/2018 Date of Consult: 08/06/2018  Primary Care Provider: Inc, [facility] Primary Cardiologist: new to [facility] - consult by End   Patient Profile:   Elaine Fernandez is a 52 y.o. female with a hx of dysmenorrhea with menorrhagia status post hysterectomy who is being seen today for the evaluation of elevated troponin at the request of Dr. Maier.  History of Present Illness:   Elaine Fernandez has no previously known cardiac history.  She reports a 1 to 2-month history of intermittent shortness of breath and left arm paresthesias that will occasionally radiate to the right arm as well as to the bilateral legs.  The symptoms are not associated with exertion.  Patient was in her usual state of health on 10/29 when she opened her front door and her dog ran out into the front yard and into the street.  The dog was hit by a car.  The dog did not die.  In this setting, the patient developed substernal chest pressure that radiated to the left arm with associated left arm paresthesias.  Approximately 30 minutes later, the patient started developed shortness of breath as well as diaphoresis.  No nausea, vomiting, presyncope, or syncope.  Patient [place] the street from a drugstore and went to check her blood pressure.  BP was noted to be in the 140s over low 100s.  Several minutes later BP has trended up into the 160s over 1 teens.  She was advised to contact her PCP.  [facility] contacted [facility] who advised her to go to the ED.  Of note, when patient's mother passed away in 08/2017 patient had similar symptoms upon hearing the news of her mother's passing.  Patient denies any prior history of tobacco abuse, alcohol use, or illegal drug abuse.  She denies any history of diabetes, hypertension, or hyperlipidemia.  There is no family history of premature coronary disease.  Her  mother and father both had MIs at age 70 and 74 respectively.  Upon the patient's arrival to [facility] they were found to have BP into the 160s over low 100s. EKG was nonacute as below, CXR showed no acute cardiopulmonary process. Labs showed troponin 0.04 trending to 0.21 trending to 0.22, CK 78, CBC unremarkable, potassium 3.7, glucose 116, serum creatinine 0.75, TSH normal, LDL 118, A1c pending, magnesium 2.1.  Patient reports her chest pain and left arm paresthesias lasted "all day."  In the ED, she was given ASA 324 mg x 1, Norco, sublingual nitroglycerin x1.  Initially, there were plans to discharge her from the ED however her troponin subsequently trended up to 0.21 as above and she was admitted.  She is currently chest pain-free.  Past Medical History:  Diagnosis Date  . Dysmenorrhea   . Menorrhagia     Past Surgical History:  Procedure Laterality Date  . ABDOMINAL HYSTERECTOMY    . CHOLECYSTECTOMY    . HERNIA REPAIR       Home Meds: Prior to Admission medications   Medication Sig Start Date End Date Taking? Authorizing Provider  ibuprofen (ADVIL,MOTRIN) 200 MG tablet Take 400-800 mg by mouth every 6 (six) hours as needed for pain.   Yes [provider]    Inpatient Medications: Scheduled Meds: . [START ON 08/07/2018] aspirin  81 mg Oral Pre-Cath  . docusate sodium  100 mg Oral BID  . heparin  5,000 Units Subcutaneous   Q8H  . sodium chloride flush  3 mL Intravenous Q12H   Continuous Infusions: . sodium chloride    . [START ON 08/07/2018] sodium chloride     Followed by  . [START ON 08/07/2018] sodium chloride     PRN Meds: sodium chloride, acetaminophen **OR** acetaminophen, bisacodyl, HYDROcodone-acetaminophen, LORazepam, nitroGLYCERIN, ondansetron **OR** ondansetron (ZOFRAN) IV, sodium chloride flush, traZODone  Allergies:  No Known Allergies  Social History:   Social History   Socioeconomic History  . Marital status: Married    Spouse name: Not on file  .  Number of children: Not on file  . Years of education: Not on file  . Highest education level: Not on file  Occupational History  . Not on file  Social Needs  . Financial resource strain: Not on file  . Food insecurity:    Worry: Not on file    Inability: Not on file  . Transportation needs:    Medical: Not on file    Non-medical: Not on file  Tobacco Use  . Smoking status: Never Smoker  . Smokeless tobacco: Never Used  Substance and Sexual Activity  . Alcohol use: No    Frequency: Never  . Drug use: Not on file  . Sexual activity: Not on file  Lifestyle  . Physical activity:    Days per week: Not on file    Minutes per session: Not on file  . Stress: Not on file  Relationships  . Social connections:    Talks on phone: Not on file    Gets together: Not on file    Attends religious service: Not on file    Active member of club or organization: Not on file    Attends meetings of clubs or organizations: Not on file    Relationship status: Not on file  . Intimate partner violence:    Fear of current or ex partner: Not on file    Emotionally abused: Not on file    Physically abused: Not on file    Forced sexual activity: Not on file  Other Topics Concern  . Not on file  Social History Narrative  . Not on file     Family History:   Family History  Problem Relation Age of Onset  . CAD Mother   . Diabetes Mother   . CAD Father   . Diabetes Father     ROS:  Review of Systems  Constitutional: Positive for malaise/fatigue. Negative for chills, diaphoresis, fever and weight loss.  HENT: Negative for congestion.   Eyes: Negative for discharge and redness.  Respiratory: Negative for cough, hemoptysis, sputum production, shortness of breath and wheezing.   Cardiovascular: Positive for chest pain. Negative for palpitations, orthopnea, claudication, leg swelling and PND.  Gastrointestinal: Negative for abdominal pain, blood in stool, heartburn, melena, nausea and vomiting.   Genitourinary: Negative for hematuria.  Musculoskeletal: Negative for falls and myalgias.  Skin: Negative for rash.  Neurological: Positive for tingling, sensory change and weakness. Negative for dizziness, tremors, speech change, focal weakness and loss of consciousness.  Endo/Heme/Allergies: Does not bruise/bleed easily.  Psychiatric/Behavioral: Negative for substance abuse. The patient is nervous/anxious.   All other systems reviewed and are negative.     Physical Exam/Data:   Vitals:   08/05/18 2300 08/05/18 2331 08/06/18 0627 08/06/18 0821  BP: (!) 103/92 (!) 157/86 113/64 (!) 152/87  Pulse: 70 61 (!) 59 63  Resp: 15 18 15   Temp:  97.9 F (36.6 C) 98.4 F (36.9   C) 97.9 F (36.6 C)  TempSrc:  Oral Oral Oral  SpO2: 98% 98% 100% 99%  Weight:      Height:        Intake/Output Summary (Last 24 hours) at 08/06/2018 0900 Last data filed at 08/05/2018 2331 Gross per 24 hour  Intake 0 ml  Output 100 ml  Net -100 ml   Filed Weights   08/05/18 1637  Weight: 76.2 kg   Body mass index is 27.96 kg/m.   Physical Exam: General: Well developed, well nourished, in no acute distress. Head: Normocephalic, atraumatic, sclera non-icteric, no xanthomas, nares without discharge.  Neck: Negative for carotid bruits. JVD not elevated. Lungs: Clear bilaterally to auscultation without wheezes, rales, or rhonchi. Breathing is unlabored. Heart: RRR with S1 S2. No murmurs, rubs, or gallops appreciated. Abdomen: Soft, non-tender, non-distended with normoactive bowel sounds. No hepatomegaly. No rebound/guarding. No obvious abdominal masses. Msk:  Strength and tone appear normal for age. Extremities: No clubbing or cyanosis. No edema. Distal pedal pulses are 2+ and equal bilaterally. Neuro: Alert and oriented X 3. No facial asymmetry. No focal deficit. Moves all extremities spontaneously. Psych:  Responds to questions appropriately with a normal affect.   EKG:  The EKG was personally  reviewed and demonstrates: NSR, 82 bpm, normal axis, nonspecific ST-T changes V2 Telemetry:  Telemetry was personally reviewed and demonstrates: NSR  Weights: Filed Weights   08/05/18 1637  Weight: 76.2 kg    Relevant CV Studies: LHC and echo pending  Laboratory Data:  Chemistry Recent Labs  Lab 08/05/18 1653 08/06/18 0634  NA 140 141  K 3.7 3.7  CL 106 108  CO2 25 25  GLUCOSE 116* 102*  BUN 12 13  CREATININE 0.75 0.63  CALCIUM 9.3 8.7*  GFRNONAA >60 >60  GFRAA >60 >60  ANIONGAP 9 8    No results for input(s): PROT, ALBUMIN, AST, ALT, ALKPHOS, BILITOT in the last 168 hours. Hematology Recent Labs  Lab 08/05/18 1653 08/06/18 0634  WBC 7.3 6.6  RBC 4.59 4.18  HGB 14.5 13.2  HCT 43.4 39.7  MCV 94.6 95.0  MCH 31.6 31.6  MCHC 33.4 33.2  RDW 11.9 11.9  PLT 261 229   Cardiac Enzymes Recent Labs  Lab 08/05/18 1653 08/05/18 2042 08/06/18 0634  TROPONINI 0.04* 0.21* 0.22*   No results for input(s): TROPIPOC in the last 168 hours.  BNPNo results for input(s): BNP, PROBNP in the last 168 hours.  DDimer No results for input(s): DDIMER in the last 168 hours.  Radiology/Studies:  Dg Chest 2 View  Result Date: 08/05/2018 IMPRESSION: No active cardiopulmonary disease. Electronically Signed   By: Paul  Barry M.D.   On: 08/05/2018 17:17    Assessment and Plan:   1.  Non-STEMI: -Currently chest pain-free -Troponin has trended to 0.22, continue to cycle until peak -N.p.o. -Scheduled for LHC with Dr. End this morning -Echo pending -ASA -Check lipid panel and A1c for further risk stratification -Given patient's history of similar events at time of notification of her mother's passing as well as her significant emotional trauma occurring just prior to the onset of her symptoms on 10/29 there is certainly concern for stress-induced cardiomyopathy.  Await cardiac catheterizations as above -Risks and benefits of cardiac catheterization have been discussed with the  patient including risks of bleeding, bruising, infection, kidney damage, stroke, heart attack, and death. The patient understands these risks and is willing to proceed with the procedure. All questions have been answered and concerns listened to    2.  Hyperlipidemia: -Start Lipitor  3.  Hypercalcemia: -Check A1c  4.  Hypertension: -Improving -Following LHC, if BP remains elevated would look to add low-dose antihypertensive therapy and titrate as needed  5.  Language barrier: -Full consult was performed in the presence of Dr. End and the hospital medical interpreter   For questions or updates, please contact [facility] Please consult [email or web address] for contact info under Cardiology/STEMI.   Signed, Dejane Scheibe, PA-C [facility] Pager: [phone] 08/06/2018, 9:00 AM    

## 2018-08-06 NOTE — Progress Notes (Signed)
[facility] [place] [facility]   PATIENT NAME: Elaine Fernandez    MR#:  [id number]  DATE OF BIRTH:  [birth year]  SUBJECTIVE: Patient seen at bedside, no chest pain.  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  Patient had no further chest pain, medical management advised by cardiology, unable to get intervention due to small vessel size.  CHIEF COMPLAINT:   Chief Complaint  Patient presents with  . Hypertension    REVIEW OF SYSTEMS:    Review of Systems  Constitutional: Negative for chills and fever.  HENT: Negative for hearing loss.   Eyes: Negative for blurred vision, double vision and photophobia.  Respiratory: Negative for cough, hemoptysis and shortness of breath.   Cardiovascular: Negative for palpitations, orthopnea and leg swelling.  Gastrointestinal: Negative for abdominal pain, diarrhea and vomiting.  Genitourinary: Negative for dysuria and urgency.  Musculoskeletal: Negative for myalgias and neck pain.  Skin: Negative for rash.  Neurological: Negative for dizziness, focal weakness, seizures, weakness and headaches.  Psychiatric/Behavioral: Negative for memory loss. The patient does not have insomnia.     Nutrition:  Tolerating Diet: Tolerating PT:      DRUG ALLERGIES:  No Known Allergies  VITALS:  Blood pressure 110/76, pulse 65, temperature 98.2 F (36.8 C), temperature source Oral, resp. rate (!) 22, height 5\' 5"  (1.651 m), weight 76.2 kg, SpO2 99 %.  PHYSICAL EXAMINATION:   Physical Exam  GENERAL:  52 y.o.-year-old patient lying in the bed with no acute distress.  EYES: Pupils equal, round, reactive to light and accommodation. No scleral icterus. Extraocular muscles intact.  HEENT: Head atraumatic, normocephalic. Oropharynx and nasopharynx clear.  NECK:  Supple, no jugular venous distention. No thyroid enlargement, no tenderness.  LUNGS: Normal breath sounds bilaterally, no wheezing,  rales,rhonchi or crepitation. No use of accessory muscles of respiration.  CARDIOVASCULAR: S1, S2 normal. No murmurs, rubs, or gallops.  ABDOMEN: Soft, nontender, nondistended. Bowel sounds present. No organomegaly or mass.  EXTREMITIES: No pedal edema, cyanosis, or clubbing.  NEUROLOGIC: Cranial nerves II through XII are intact. Muscle strength 5/5 in all extremities. Sensation intact. Gait not checked.  PSYCHIATRIC: The patient is alert and oriented x 3.  SKIN: No obvious rash, lesion, or ulcer.    LABORATORY PANEL:   CBC Recent Labs  Lab 08/06/18 0634  WBC 6.6  HGB 13.2  HCT 39.7  PLT 229   ------------------------------------------------------------------------------------------------------------------  Chemistries  Recent Labs  Lab 08/06/18 0634  NA 141  K 3.7  CL 108  CO2 25  GLUCOSE 102*  BUN 13  CREATININE 0.63  CALCIUM 8.7*  MG 2.1   ------------------------------------------------------------------------------------------------------------------  Cardiac Enzymes Recent Labs  Lab 08/06/18 0634  TROPONINI 0.22*   ------------------------------------------------------------------------------------------------------------------  RADIOLOGY:  Dg Chest 2 View  Result Date: 08/05/2018 CLINICAL DATA:  Chest pain, hypertension EXAM: CHEST - 2 VIEW COMPARISON:  Chest x-ray of 08/13/2017 FINDINGS: No active infiltrate or effusion is seen. Mediastinal and hilar contours are unremarkable. The heart is within upper limits of normal. No bony abnormality is seen. IMPRESSION: No active cardiopulmonary disease. Electronically Signed   By: Dwyane Dee M.D.   On: 08/05/2018 17:17     ASSESSMENT AND PLAN:   Active Problems:   Chest pain   Non-ST elevation (NSTEMI) myocardial infarction [facility])  Chest pain, shortness of breath mild elevated troponins, non-ST elevation MI, possible stress-induced chest pain:   post cardiac cath showing significant disease and small results  unable to do any PCI because of small vessel size.  Patient has no further chest pain, patient is on aspirin, beta-blockers, patient needs to stay 1 more night, likely discharge home tomorrow.  Hyperlipidemia, continue statins, keep LDL less than 70.  Started on statins, discussed with patient that likely discharge tomorrow with beta-blockers, statins.  Aspirin.   All the records are reviewed and case discussed with Care Management/Social Workerr. Management plans discussed with the patient, family and they are in agreement.  CODE STATUS: Full code  TOTAL TIME TAKING CARE OF THIS PATIENT:35 minutes.   POSSIBLE D/C IN 1-2 DAYS, DEPENDING ON CLINICAL CONDITION.   Katha Hamming M.D on 08/06/2018 at 2:11 PM  Between 7am to 6pm - Pager - [phone]  After 6pm go to [email or web address] - password [email or web address] [facility]  [facility]  Office  [phone]  CC: Primary care physician; Inc, [employer]

## 2018-08-06 NOTE — H&P (View-Only) (Signed)
Cardiology Consultation:   Patient ID: Elaine Fernandez; [id number]; 03-06-1966   Admit date: 08/05/2018 Date of Consult: 08/06/2018  Primary Care Provider: Inc, [facility] Primary Cardiologist: new to [facility] - consult by End   Patient Profile:   Elaine Fernandez is a 52 y.o. female with a hx of dysmenorrhea with menorrhagia status post hysterectomy who is being seen today for the evaluation of elevated troponin at the request of Dr. Caryn Bee.  History of Present Illness:   Elaine Fernandez has no previously known cardiac history.  She reports a 1 to 13-month history of intermittent shortness of breath and left arm paresthesias that will occasionally radiate to the right arm as well as to the bilateral legs.  The symptoms are not associated with exertion.  Patient was in her usual state of health on 10/29 when she opened her front door and her dog ran out into the front yard and into the street.  The dog was hit by a car.  The dog did not die.  In this setting, the patient developed substernal chest pressure that radiated to the left arm with associated left arm paresthesias.  Approximately 30 minutes later, the patient started developed shortness of breath as well as diaphoresis.  No nausea, vomiting, presyncope, or syncope.  Patient [place] the street from a drugstore and went to check her blood pressure.  BP was noted to be in the 140s over low 100s.  Several minutes later BP has trended up into the 160s over 1 teens.  She was advised to contact her PCP.  [facility] who advised her to go to the ED.  Of note, when patient's mother passed away in 09-02-2017 patient had similar symptoms upon hearing the news of her mother's passing.  Patient denies any prior history of tobacco abuse, alcohol use, or illegal drug abuse.  She denies any history of diabetes, hypertension, or hyperlipidemia.  There is no family history of premature coronary disease.  Her  mother and father both had MIs at age 62 and 16 respectively.  Upon the patient's arrival to [facility] they were found to have BP into the 160s over low 100s. EKG was nonacute as below, CXR showed no acute cardiopulmonary process. Labs showed troponin 0.04 trending to 0.21 trending to 0.22, CK 78, CBC unremarkable, potassium 3.7, glucose 116, serum creatinine 0.75, TSH normal, LDL 118, A1c pending, magnesium 2.1.  Patient reports her chest pain and left arm paresthesias lasted "all day."  In the ED, she was given ASA 324 mg x 1, Norco, sublingual nitroglycerin x1.  Initially, there were plans to discharge her from the ED however her troponin subsequently trended up to 0.21 as above and she was admitted.  She is currently chest pain-free.  Past Medical History:  Diagnosis Date  . Dysmenorrhea   . Menorrhagia     Past Surgical History:  Procedure Laterality Date  . ABDOMINAL HYSTERECTOMY    . CHOLECYSTECTOMY    . HERNIA REPAIR       Home Meds: Prior to Admission medications   Medication Sig Start Date End Date Taking? Authorizing Provider  ibuprofen (ADVIL,MOTRIN) 200 MG tablet Take 400-800 mg by mouth every 6 (six) hours as needed for pain.   Yes [provider]    Inpatient Medications: Scheduled Meds: . [START ON 08/07/2018] aspirin  81 mg Oral Pre-Cath  . docusate sodium  100 mg Oral BID  . heparin  5,000 Units Subcutaneous  Q8H  . sodium chloride flush  3 mL Intravenous Q12H   Continuous Infusions: . sodium chloride    . [START ON 08/07/2018] sodium chloride     Followed by  . [START ON 08/07/2018] sodium chloride     PRN Meds: sodium chloride, acetaminophen **OR** acetaminophen, bisacodyl, HYDROcodone-acetaminophen, LORazepam, nitroGLYCERIN, ondansetron **OR** ondansetron (ZOFRAN) IV, sodium chloride flush, traZODone  Allergies:  No Known Allergies  Social History:   Social History   Socioeconomic History  . Marital status: Married    Spouse name: Not on file  .  Number of children: Not on file  . Years of education: Not on file  . Highest education level: Not on file  Occupational History  . Not on file  Social Needs  . Financial resource strain: Not on file  . Food insecurity:    Worry: Not on file    Inability: Not on file  . Transportation needs:    Medical: Not on file    Non-medical: Not on file  Tobacco Use  . Smoking status: Never Smoker  . Smokeless tobacco: Never Used  Substance and Sexual Activity  . Alcohol use: No    Frequency: Never  . Drug use: Not on file  . Sexual activity: Not on file  Lifestyle  . Physical activity:    Days per week: Not on file    Minutes per session: Not on file  . Stress: Not on file  Relationships  . Social connections:    Talks on phone: Not on file    Gets together: Not on file    Attends religious service: Not on file    Active member of club or organization: Not on file    Attends meetings of clubs or organizations: Not on file    Relationship status: Not on file  . Intimate partner violence:    Fear of current or ex partner: Not on file    Emotionally abused: Not on file    Physically abused: Not on file    Forced sexual activity: Not on file  Other Topics Concern  . Not on file  Social History Narrative  . Not on file     Family History:   Family History  Problem Relation Age of Onset  . CAD Mother   . Diabetes Mother   . CAD Father   . Diabetes Father     ROS:  Review of Systems  Constitutional: Positive for malaise/fatigue. Negative for chills, diaphoresis, fever and weight loss.  HENT: Negative for congestion.   Eyes: Negative for discharge and redness.  Respiratory: Negative for cough, hemoptysis, sputum production, shortness of breath and wheezing.   Cardiovascular: Positive for chest pain. Negative for palpitations, orthopnea, claudication, leg swelling and PND.  Gastrointestinal: Negative for abdominal pain, blood in stool, heartburn, melena, nausea and vomiting.   Genitourinary: Negative for hematuria.  Musculoskeletal: Negative for falls and myalgias.  Skin: Negative for rash.  Neurological: Positive for tingling, sensory change and weakness. Negative for dizziness, tremors, speech change, focal weakness and loss of consciousness.  Endo/Heme/Allergies: Does not bruise/bleed easily.  Psychiatric/Behavioral: Negative for substance abuse. The patient is nervous/anxious.   All other systems reviewed and are negative.     Physical Exam/Data:   Vitals:   08/05/18 2300 08/05/18 2331 08/06/18 0627 08/06/18 0821  BP: (!) 103/92 (!) 157/86 113/64 (!) 152/87  Pulse: 70 61 (!) 59 63  Resp: 15 18 15    Temp:  97.9 F (36.6 C) 98.4 F (36.9  C) 97.9 F (36.6 C)  TempSrc:  Oral Oral Oral  SpO2: 98% 98% 100% 99%  Weight:      Height:        Intake/Output Summary (Last 24 hours) at 08/06/2018 0900 Last data filed at 08/05/2018 2331 Gross per 24 hour  Intake 0 ml  Output 100 ml  Net -100 ml   Filed Weights   08/05/18 1637  Weight: 76.2 kg   Body mass index is 27.96 kg/m.   Physical Exam: General: Well developed, well nourished, in no acute distress. Head: Normocephalic, atraumatic, sclera non-icteric, no xanthomas, nares without discharge.  Neck: Negative for carotid bruits. JVD not elevated. Lungs: Clear bilaterally to auscultation without wheezes, rales, or rhonchi. Breathing is unlabored. Heart: RRR with S1 S2. No murmurs, rubs, or gallops appreciated. Abdomen: Soft, non-tender, non-distended with normoactive bowel sounds. No hepatomegaly. No rebound/guarding. No obvious abdominal masses. Msk:  Strength and tone appear normal for age. Extremities: No clubbing or cyanosis. No edema. Distal pedal pulses are 2+ and equal bilaterally. Neuro: Alert and oriented X 3. No facial asymmetry. No focal deficit. Moves all extremities spontaneously. Psych:  Responds to questions appropriately with a normal affect.   EKG:  The EKG was personally  reviewed and demonstrates: NSR, 82 bpm, normal axis, nonspecific ST-T changes V2 Telemetry:  Telemetry was personally reviewed and demonstrates: NSR  Weights: Filed Weights   08/05/18 1637  Weight: 76.2 kg    Relevant CV Studies: LHC and echo pending  Laboratory Data:  Chemistry Recent Labs  Lab 08/05/18 1653 08/06/18 0634  NA 140 141  K 3.7 3.7  CL 106 108  CO2 25 25  GLUCOSE 116* 102*  BUN 12 13  CREATININE 0.75 0.63  CALCIUM 9.3 8.7*  GFRNONAA >60 >60  GFRAA >60 >60  ANIONGAP 9 8    No results for input(s): PROT, ALBUMIN, AST, ALT, ALKPHOS, BILITOT in the last 168 hours. Hematology Recent Labs  Lab 08/05/18 1653 08/06/18 0634  WBC 7.3 6.6  RBC 4.59 4.18  HGB 14.5 13.2  HCT 43.4 39.7  MCV 94.6 95.0  MCH 31.6 31.6  MCHC 33.4 33.2  RDW 11.9 11.9  PLT 261 229   Cardiac Enzymes Recent Labs  Lab 08/05/18 1653 08/05/18 2042 08/06/18 0634  TROPONINI 0.04* 0.21* 0.22*   No results for input(s): TROPIPOC in the last 168 hours.  BNPNo results for input(s): BNP, PROBNP in the last 168 hours.  DDimer No results for input(s): DDIMER in the last 168 hours.  Radiology/Studies:  Dg Chest 2 View  Result Date: 08/05/2018 IMPRESSION: No active cardiopulmonary disease. Electronically Signed   By: Dwyane Dee M.D.   On: 08/05/2018 17:17    Assessment and Plan:   1.  Non-STEMI: -Currently chest pain-free -Troponin has trended to 0.22, continue to cycle until peak -N.p.o. -Scheduled for LHC with Dr. Okey Dupre this morning -Echo pending -ASA -Check lipid panel and A1c for further risk stratification -Given patient's history of similar events at time of notification of her mother's passing as well as her significant emotional trauma occurring just prior to the onset of her symptoms on 10/29 there is certainly concern for stress-induced cardiomyopathy.  Await cardiac catheterizations as above -Risks and benefits of cardiac catheterization have been discussed with the  patient including risks of bleeding, bruising, infection, kidney damage, stroke, heart attack, and death. The patient understands these risks and is willing to proceed with the procedure. All questions have been answered and concerns listened to  2.  Hyperlipidemia: -Start Lipitor  3.  Hypercalcemia: -Check A1c  4.  Hypertension: -Improving -Following LHC, if BP remains elevated would look to add low-dose antihypertensive therapy and titrate as needed  5.  Language barrier: -Full consult was performed in the presence of Dr. Okey Dupre and the hospital medical interpreter   For questions or updates, please contact [facility] Please consult [email or web address] for contact info under Cardiology/STEMI.   Signed, Eula Listen, PA-C [facility] Pager: [phone] 08/06/2018, 9:00 AM

## 2018-08-06 NOTE — Interval H&P Note (Signed)
History and Physical Interval Note:  08/06/2018 10:04 AM  Elaine Fernandez  has presented today for cardiac catheterization, with the diagnosis of NSTEMI. The various methods of treatment have been discussed with the patient and family. After consideration of risks, benefits and other options for treatment, the patient has consented to  Procedure(s): LEFT HEART CATH AND CORONARY ANGIOGRAPHY (N/A) as a surgical intervention .  The patient's history has been reviewed, patient examined, no change in status, stable for surgery.  I have reviewed the patient's chart and labs.  Questions were answered to the patient's satisfaction.    Cath Lab Visit (complete for each Cath Lab visit)  Clinical Evaluation Leading to the Procedure:   ACS: Yes.    Non-ACS:  N/A  Sayed Apostol

## 2018-08-06 NOTE — Progress Notes (Signed)
*  PRELIMINARY RESULTS* Echocardiogram 2D Echocardiogram has been performed.  Cristela Blue 08/06/2018, 2:04 PM

## 2018-08-07 ENCOUNTER — Telehealth: Payer: Self-pay | Admitting: Internal Medicine

## 2018-08-07 LAB — BASIC METABOLIC PANEL
Anion gap: 5 (ref 5–15)
BUN: 14 mg/dL (ref 6–20)
CALCIUM: 8.7 mg/dL — AB (ref 8.9–10.3)
CHLORIDE: 108 mmol/L (ref 98–111)
CO2: 27 mmol/L (ref 22–32)
CREATININE: 0.77 mg/dL (ref 0.44–1.00)
GFR calc non Af Amer: >60 mL/min (ref >60)
Glucose, Bld: 112 mg/dL — ABNORMAL HIGH (ref 70–99)
Potassium: 3.8 mmol/L (ref 3.5–5.1)
SODIUM: 140 mmol/L (ref 135–145)

## 2018-08-07 LAB — CBC
HEMATOCRIT: 41.2 % (ref 36.0–46.0)
HEMOGLOBIN: 13.7 g/dL (ref 12.0–15.0)
MCH: 31.8 pg (ref 26.0–34.0)
MCHC: 33.3 g/dL (ref 30.0–36.0)
MCV: 95.6 fL (ref 80.0–100.0)
Platelets: 220 10*3/uL (ref 150–400)
RBC: 4.31 MIL/uL (ref 3.87–5.11)
RDW: 11.9 % (ref 11.5–15.5)
WBC: 7.3 10*3/uL (ref 4.0–10.5)
nRBC: 0 % (ref 0.0–0.2)

## 2018-08-07 LAB — GLUCOSE, CAPILLARY: Glucose-Capillary: 111 mg/dL — ABNORMAL HIGH (ref 70–99)

## 2018-08-07 LAB — HIV ANTIBODY (ROUTINE TESTING W REFLEX): HIV Screen 4th Generation wRfx: NONREACTIVE

## 2018-08-07 MED ORDER — ATORVASTATIN CALCIUM 40 MG PO TABS: 40.0000 mg | ORAL_TABLET | Freq: Every day | ORAL | 0 refills | Status: DC

## 2018-08-07 MED ORDER — METOPROLOL TARTRATE 25 MG PO TABS: 12.5000 mg | ORAL_TABLET | Freq: Two times a day (BID) | ORAL | 0 refills | Status: DC

## 2018-08-07 MED ORDER — ASPIRIN 81 MG PO CHEW: 81.0000 mg | CHEWABLE_TABLET | Freq: Every day | ORAL | 0 refills | Status: DC

## 2018-08-07 NOTE — Discharge Instructions (Signed)
You have been seen in the Emergency Department (ED) today for chest pain.  As we have discussed todays test results are normal, but you may require further testing.  Please follow up with the recommended doctor as instructed above in these documents regarding todays emergent visit and your recent symptoms to discuss further management.  Continue to take your regular medications. If you are not doing so already, please also take a daily baby aspirin (81 mg), at least until you follow up with your doctor.  Return to the Emergency Department (ED) if you experience any further chest pain/pressure/tightness, difficulty breathing, or sudden sweating, or other symptoms that concern you.  General Instructions for Discharge after Cardiac Catheterization Rest the arm where the procedure was performed for one week:  Do not lift, push or pull anything heavier than five pounds with that arm. Five pounds is about a gallon of milk. If getting up from a chair, do not push off with that hand. Do not soak the arm in water, such as doing dishes, in a pool, or bath. When taking a shower, do not scrub the site. Monitor for signs of Infection: Redness, warmth or drainage at the site. Fever over 100F If you start to bleed, hold firm, direct pressure at the site for 15 minutes, then gradually release. If you are still bleeding, continue to hold pressure and call 911.

## 2018-08-07 NOTE — Telephone Encounter (Signed)
Patient is now scheduled for TCM on Nov 14th with Ward Givens    Patient will need interpreter when we call

## 2018-08-07 NOTE — Progress Notes (Signed)
[name] to be D/C'd Home per MD order. Patient given discharge teaching and paperwork regarding medications, diet, follow-up appointments and activity. Patient understanding verbalized. No questions or complaints at this time. Skin condition as charted. IV and telemetry removed prior to leaving.  No further needs by Care Management/Social Work. Prescriptions sent to pharmacy.  An After Visit Summary was printed and given to the patient.   [facility] Interpreter was present to assist with discharge.  Patient escorted via wheelchair by volunteer.  Elaine Fernandez

## 2018-08-07 NOTE — Discharge Summary (Signed)
Elaine Fernandez, is a 52 y.o. female  DOB September 12, 1966  MRN [id number].  Admission date:  08/05/2018  Admitting Physician  Cammy Copa, MD  Discharge Date:  08/07/2018   Primary MD  Inc, [facility]  Recommendations for primary care physician for things to follow:   Follow-up with PCP in 1 week Follow-up with Dr.End  In 2 weeks   Admission Diagnosis  Chest pain with high risk of acute coronary syndrome [R07.9]   Discharge Diagnosis  Chest pain with high risk of acute coronary syndrome [R07.9]    Active Problems:   Chest pain   Non-ST elevation (NSTEMI) myocardial infarction [facility])      Past Medical History:  Diagnosis Date  . Dysmenorrhea   . Menorrhagia     Past Surgical History:  Procedure Laterality Date  . ABDOMINAL HYSTERECTOMY    . CHOLECYSTECTOMY    . HERNIA REPAIR    . LEFT HEART CATH AND CORONARY ANGIOGRAPHY N/A 08/06/2018   Procedure: LEFT HEART CATH AND CORONARY ANGIOGRAPHY;  Surgeon: Yvonne Kendall, MD;  Location: [facility] INVASIVE CV LAB;  Service: Cardiovascular;  Laterality: N/A;       History of present illness and  Hospital Course:     Kindly see H&P for history of present illness and admission details, please review complete Labs, Consult reports and Test reports for all details in brief  HPI  from the history and physical done on the day of admission 52 year old female patient admitted because of left-sided chest pain.  Patient has history of dysmenorrhea, menorrhagia, hysterectomy.  Chest pain started after she witnessed her dog ran into the street and was struck by a car.   Hospital Course  #1 chest pressure due to non-ST elevation MI, troponin peaked up to 0.22, patient did have left heart cath by Dr. 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  Started on aspirin 81 mg daily indefinitely, metoprolol 12.5 mg p.o. twice daily, echocardiogram showed EF 55 to 60% with normal left ventricular cavity.  Patient is better now, no chest pain.  Chest pressure likely brought up by recent death of her mother and also death of her dog which can be stress induced.     #2 .essential hypertension, BP was high when she checked outside, patient is on metoprolol now, blood pressure has been stable while she is in the here. 3. hyperlipidemia, patient LDL 118, started on statins, will keep less than 70.   Discharge Condition: stable   Follow UP  Follow-up Information    Inc, [facility]. Schedule an appointment as soon as possible for a visit.   Contact information: [address] [place] [address] [phone]        Yvonne Kendall, MD. Schedule an appointment as soon as possible for a visit in 2 week(s).   Specialty:  Cardiology Contact information: [address] [place] [address] [phone]             Discharge Instructions  and  Discharge Medications      Allergies as of 08/07/2018   No Known Allergies     Medication List    STOP taking these medications   ibuprofen 200 MG tablet Commonly known as:  ADVIL,MOTRIN  TAKE these medications   aspirin 81 MG chewable tablet Chew 1 tablet (81 mg total) by mouth daily.   atorvastatin 40 MG tablet Commonly known as:  LIPITOR Take 1 tablet (40 mg total) by mouth daily at 6 PM.   metoprolol tartrate 25 MG tablet Commonly known as:  LOPRESSOR Take 0.5 tablets (12.5 mg total) by mouth 2 (two) times daily.         Diet and Activity recommendation: See Discharge Instructions  above   Consults obtained - cardio   Major procedures and Radiology Reports - PLEASE review detailed and final reports for all details, in brief -      Dg Chest 2 View  Result Date: 08/05/2018 CLINICAL DATA:  Chest pain, hypertension EXAM: CHEST - 2 VIEW COMPARISON:  Chest x-ray of 08/13/2017 FINDINGS: No active infiltrate or effusion is seen. Mediastinal and hilar contours are unremarkable. The heart is within upper limits of normal. No bony abnormality is seen. IMPRESSION: No active cardiopulmonary disease. Electronically Signed   By: Dwyane Dee M.D.   On: 08/05/2018 17:17    Micro Results     No results found for this or any previous visit (from the past 240 hour(s)).     Today   Subjective:   Kynsli Haapala today has no headache,no chest abdominal pain,no new weakness tingling or numbness, feels much better wants to go home today.   Objective:   Blood pressure 139/90, pulse 64, temperature 98.1 F (36.7 C), temperature source Oral, resp. rate 20, height 5\' 5"  (1.651 m), weight 75.1 kg, SpO2 98 %.   Intake/Output Summary (Last 24 hours) at 08/07/2018 0911 Last data filed at 08/07/2018 0503 Gross per 24 hour  Intake -  Output 1000 ml  Net -1000 ml    Exam Awake Alert, Oriented x 3, No new F.N deficits, Normal affect [place].AT,PERRAL Supple Neck,No JVD, No cervical lymphadenopathy appriciated.  Symmetrical Chest wall movement, Good air movement bilaterally, CTAB RRR,No Gallops,Rubs or new Murmurs, No Parasternal Heave +ve B.Sounds, Abd Soft, Non tender, No organomegaly appriciated, No rebound -guarding or rigidity. No Cyanosis, Clubbing or edema, No new Rash or bruise  Data Review   CBC w Diff:  Lab Results  Component Value Date   WBC 7.3 08/07/2018   HGB 13.7 08/07/2018   HGB 12.3 04/14/2014   HCT 41.2 08/07/2018   HCT 37.6 04/14/2014   PLT 220 08/07/2018   PLT 226 04/14/2014   LYMPHOPCT 27.0 04/14/2014   MONOPCT 8.5 04/14/2014   EOSPCT 2.0  04/14/2014   BASOPCT 0.3 04/14/2014    CMP:  Lab Results  Component Value Date   NA 140 08/07/2018   NA 143 04/14/2014   K 3.8 08/07/2018   K 3.5 04/14/2014   CL 108 08/07/2018   CL 107 04/14/2014   CO2 27 08/07/2018   CO2 25 04/14/2014   BUN 14 08/07/2018   BUN 10 04/14/2014   CREATININE 0.77 08/07/2018   CREATININE 0.75 04/14/2014   PROT 7.6 06/16/2014   ALBUMIN 3.7 06/16/2014   BILITOT 0.6 06/16/2014   ALKPHOS 89 06/16/2014   AST 10 (L) 06/16/2014   ALT 14 06/16/2014  .   Total Time in preparing paper work, data evaluation and todays exam - 35 minutes  Katha Hamming M.D on 08/07/2018 at 9:11 AM    Note: This dictation was prepared with Dragon dictation along with smaller phrase technology. Any transcriptional errors that result from this process are unintentional.

## 2018-08-07 NOTE — Telephone Encounter (Signed)
-----   Message from Iran Ouch, MD sent at 08/07/2018  8:46 AM EDT ----- TCM follow-up needed in 1 to 2 weeks after non-STEMI.  The patient was seen by Dr. Okey Dupre

## 2018-08-07 NOTE — Progress Notes (Signed)
Progress Note  Patient Name: Elaine Fernandez Date of Encounter: 08/07/2018  Primary Cardiologist: Dr. Okey Dupre  Subjective   No further chest pain or shortness of breath.  Inpatient Medications    Scheduled Meds: . aspirin  81 mg Oral Daily  . atorvastatin  40 mg Oral q1800  . docusate sodium  100 mg Oral BID  . metoprolol tartrate  12.5 mg Oral BID  . sodium chloride flush  3 mL Intravenous Q12H   Continuous Infusions: . sodium chloride     PRN Meds: sodium chloride, acetaminophen **OR** acetaminophen, bisacodyl, HYDROcodone-acetaminophen, LORazepam, nitroGLYCERIN, ondansetron **OR** ondansetron (ZOFRAN) IV, sodium chloride flush, traZODone   Vital Signs    Vitals:   08/06/18 1724 08/06/18 1936 08/07/18 0333 08/07/18 0806  BP: 136/79 119/77 121/78 139/90  Pulse: 61 70 71 64  Resp:  18 18 20   Temp: 98.1 F (36.7 C) 98.6 F (37 C) 98 F (36.7 C) 98.1 F (36.7 C)  TempSrc: Oral Oral Oral Oral  SpO2: 96% 99% 98% 98%  Weight:   75.1 kg   Height:        Intake/Output Summary (Last 24 hours) at 08/07/2018 0841 Last data filed at 08/07/2018 0503 Gross per 24 hour  Intake -  Output 1000 ml  Net -1000 ml   Filed Weights   08/05/18 1637 08/06/18 0917 08/07/18 0333  Weight: 76.2 kg 76.2 kg 75.1 kg    Telemetry    Normal sinus rhythm with heart rate in the 60s.- Personally Reviewed  ECG     - Personally Reviewed  Physical Exam   GEN: No acute distress.   Neck: No JVD Cardiac: RRR, no murmurs, rubs, or gallops.  Respiratory: Clear to auscultation bilaterally. GI: Soft, nontender, non-distended  MS: No edema; No deformity. Neuro:  Nonfocal  Psych: Normal affect  Right radial pulses normal.  Labs    Chemistry Recent Labs  Lab 08/05/18 1653 08/06/18 0634 08/07/18 0442  NA 140 141 140  K 3.7 3.7 3.8  CL 106 108 108  CO2 25 25 27   GLUCOSE 116* 102* 112*  BUN 12 13 14   CREATININE 0.75 0.63 0.77  CALCIUM 9.3 8.7* 8.7*  GFRNONAA >60 >60 >60    GFRAA >60 >60 >60  ANIONGAP 9 8 5      Hematology Recent Labs  Lab 08/05/18 1653 08/06/18 0634 08/07/18 0442  WBC 7.3 6.6 7.3  RBC 4.59 4.18 4.31  HGB 14.5 13.2 13.7  HCT 43.4 39.7 41.2  MCV 94.6 95.0 95.6  MCH 31.6 31.6 31.8  MCHC 33.4 33.2 33.3  RDW 11.9 11.9 11.9  PLT 261 229 220    Cardiac Enzymes Recent Labs  Lab 08/05/18 1653 08/05/18 2042 08/06/18 0634  TROPONINI 0.04* 0.21* 0.22*   No results for input(s): TROPIPOC in the last 168 hours.   BNPNo results for input(s): BNP, PROBNP in the last 168 hours.   DDimer No results for input(s): DDIMER in the last 168 hours.   Radiology    Dg Chest 2 View  Result Date: 08/05/2018 CLINICAL DATA:  Chest pain, hypertension EXAM: CHEST - 2 VIEW COMPARISON:  Chest x-ray of 08/13/2017 FINDINGS: No active infiltrate or effusion is seen. Mediastinal and hilar contours are unremarkable. The heart is within upper limits of normal. No bony abnormality is seen. IMPRESSION: No active cardiopulmonary disease. Electronically Signed   By: Dwyane Dee M.D.   On: 08/05/2018 17:17    Cardiac Studies   Cardiac cath on 08/06/2018  Conclusions:  1. Significant stenoses (90-99%) involving small branch of D1 and OM3, which likely represent culprit lesions for the patient's NSTEMI.  The large epicardial coronary arteries are angiographically normal.  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 2. Focal mid inferior hypokinesis with otherwise preserved left ventricular systolic function. 3. Mildly elevated left ventricular filling pressure.  Recommendations: 1. Medical therapy; no indication for PCI given small vessel size and lack of ongoing symptoms. 2. Indefinite aspirin 81 mg daily.  Given suspected SCAD, heparin should be avoided.  I will also defer starting a P2Y12 inhibitor. 3. Initiate metoprolol tartrate 12.5 mg BID with close monitoring of heart rate. 4. Statin therapy for  goal LDL < 70. 5. Await echocardiogram. 6. Continue inpatient monitoring for at least one more day. 7. Consider outpatient renal artery Doppler to exclude fibromuscular dysplasia.  Yvonne Kendall, MD   Patient Profile     52 y.o. female with no significant previous cardiac history and no significant risk factors who presented with chest pain after her dog ran into the street and was struck by a car.  She was found to have mildly elevated troponin.  Assessment & Plan    1.  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  I personally reviewed the cardiac catheterization images and I agree.  No indication for revascularization. Continue low-dose aspirin, small dose metoprolol and atorvastatin. Ejection fraction was normal.  The patient appears to be stable for hospital discharge.  We will arrange for follow-up in our office in 1 to 2 weeks.   For questions or updates, please contact [facility] Please consult [email or web address] for contact info under        Signed, Lorine Bears, MD  08/07/2018, 8:41 AM

## 2018-08-07 NOTE — Telephone Encounter (Signed)
Patiently currently admitted.

## 2018-08-08 ENCOUNTER — Other Ambulatory Visit: Payer: Self-pay

## 2018-08-08 ENCOUNTER — Encounter: Payer: Self-pay | Admitting: Emergency Medicine

## 2018-08-08 ENCOUNTER — Emergency Department
Admission: EM | Admit: 2018-08-08 | Discharge: 2018-08-09 | Disposition: A | Discharge status: Home / Self Care | Payer: Self-pay | Attending: Emergency Medicine | Admitting: Emergency Medicine

## 2018-08-08 ENCOUNTER — Emergency Department: Payer: Self-pay

## 2018-08-08 DIAGNOSIS — I252 Old myocardial infarction: Secondary | ICD-10-CM | POA: Insufficient documentation

## 2018-08-08 DIAGNOSIS — I2089 Other forms of angina pectoris: Secondary | ICD-10-CM

## 2018-08-08 DIAGNOSIS — R519 Headache, unspecified: Secondary | ICD-10-CM

## 2018-08-08 DIAGNOSIS — I208 Other forms of angina pectoris: Secondary | ICD-10-CM | POA: Insufficient documentation

## 2018-08-08 DIAGNOSIS — R51 Headache: Secondary | ICD-10-CM | POA: Insufficient documentation

## 2018-08-08 LAB — TROPONIN I: Troponin I: 0.05 ng/mL (ref <0.03)

## 2018-08-08 LAB — BASIC METABOLIC PANEL
Anion gap: 9 (ref 5–15)
BUN: 15 mg/dL (ref 6–20)
CALCIUM: 9.3 mg/dL (ref 8.9–10.3)
CO2: 22 mmol/L (ref 22–32)
Chloride: 109 mmol/L (ref 98–111)
Creatinine, Ser: 0.72 mg/dL (ref 0.44–1.00)
GFR calc Af Amer: >60 mL/min (ref >60)
GLUCOSE: 97 mg/dL (ref 70–99)
Potassium: 4.5 mmol/L (ref 3.5–5.1)
Sodium: 140 mmol/L (ref 135–145)

## 2018-08-08 LAB — CBC
HCT: 43.1 % (ref 36.0–46.0)
HEMOGLOBIN: 14.2 g/dL (ref 12.0–15.0)
MCH: 31.8 pg (ref 26.0–34.0)
MCHC: 32.9 g/dL (ref 30.0–36.0)
MCV: 96.4 fL (ref 80.0–100.0)
PLATELETS: 251 10*3/uL (ref 150–400)
RBC: 4.47 MIL/uL (ref 3.87–5.11)
RDW: 11.9 % (ref 11.5–15.5)
WBC: 10.6 10*3/uL — AB (ref 4.0–10.5)
nRBC: 0 % (ref 0.0–0.2)

## 2018-08-08 LAB — HEPATIC FUNCTION PANEL
ALK PHOS: 93 U/L (ref 38–126)
ALT: 14 U/L (ref 0–44)
AST: 18 U/L (ref 15–41)
Albumin: 4.1 g/dL (ref 3.5–5.0)
Bilirubin, Direct: 0.2 mg/dL (ref 0.0–0.2)
Indirect Bilirubin: 0.6 mg/dL (ref 0.3–0.9)
Total Bilirubin: 0.8 mg/dL (ref 0.3–1.2)
Total Protein: 7.3 g/dL (ref 6.5–8.1)

## 2018-08-08 MED ORDER — NITROGLYCERIN 0.4 MG SL SUBL: 0.4000 mg | SUBLINGUAL_TABLET | SUBLINGUAL | 0 refills | Status: AC | PRN

## 2018-08-08 MED ORDER — KETOROLAC TROMETHAMINE 30 MG/ML IJ SOLN
15.0000 mg | Freq: Once | INTRAMUSCULAR | Status: AC
  Administered 2018-08-09: 15 mg via INTRAVENOUS
  Filled 2018-08-08: qty 1

## 2018-08-08 MED ORDER — DIPHENHYDRAMINE HCL 50 MG/ML IJ SOLN
25.0000 mg | Freq: Once | INTRAMUSCULAR | Status: AC
  Administered 2018-08-09: 25 mg via INTRAVENOUS
  Filled 2018-08-08: qty 1

## 2018-08-08 MED ORDER — IBUPROFEN 600 MG PO TABS: 600.0000 mg | ORAL_TABLET | Freq: Three times a day (TID) | ORAL | 0 refills | Status: DC | PRN

## 2018-08-08 MED ORDER — PROCHLORPERAZINE EDISYLATE 10 MG/2ML IJ SOLN
10.0000 mg | Freq: Once | INTRAMUSCULAR | Status: AC
  Administered 2018-08-09: 10 mg via INTRAVENOUS
  Filled 2018-08-08: qty 2

## 2018-08-08 NOTE — ED Triage Notes (Signed)
Patient to ER for c/o shortness of breath and worsening chest pain. States pain is at center of chest, does not radiated to back. States neck also hurting, radiates down left arm. Was admitted from Tuesday through Thursday for chest pain (had cath done). Patient reports current worsening episode began at approx 1700, more severely in last hour.   Interpreter [phone] used.

## 2018-08-08 NOTE — ED Provider Notes (Signed)
[facility] Emergency Department Provider Note  ____________________________________________   First MD Initiated Contact with Patient 08/08/18 2257     (approximate)  I have reviewed the triage vital signs and the nursing notes.   HISTORY  Chief Complaint Chest Pain   HPI Elaine Fernandez is a 52 y.o. female who comes to the emergency department with chest pain and shortness of breath.  The pain was not sudden onset or maximal onset.  Is not ripping or tearing does not go straight to her back.  She has a complex past medical history including coronary artery disease and actually had a cardiac catheterization performed 2 days ago.  At that point she was found to have  large epicardial coronary arteries which are normal.  She does have significant stenoses involving the small branch of D1 which were felt to be the culprit lesions in a non-STEMI 3 days ago.  Dr. and who performed the catheterization felt that there was no indication for PCI given her small vessel signs.  The patient's symptoms have not really gone away ever since her discharge and she returns to the emergency department tonight for further evaluation.  She has no history of DVT or pulmonary embolism.  She has been under a tremendous amount of stress recently as her dog was recently seriously injured.  She reports compliance with her aspirin.  She was not given nitroglycerin on discharge.  Her pain is not completely constant.  It is worse with exertion and improved with rest.  She is currently not having any chest pain although does report a mild headache.   Past Medical History:  Diagnosis Date  . Dysmenorrhea   . Menorrhagia     Patient Active Problem List   Diagnosis Date Noted  . Non-ST elevation (NSTEMI) myocardial infarction (HCC)   . Chest pain 08/05/2018    Past Surgical History:  Procedure Laterality Date  . ABDOMINAL HYSTERECTOMY    . CHOLECYSTECTOMY    . HERNIA REPAIR    .  LEFT HEART CATH AND CORONARY ANGIOGRAPHY N/A 08/06/2018   Procedure: LEFT HEART CATH AND CORONARY ANGIOGRAPHY;  Surgeon: Yvonne Kendall, MD;  Location: [facility] INVASIVE CV LAB;  Service: Cardiovascular;  Laterality: N/A;    Prior to Admission medications   Medication Sig Start Date End Date Taking? Authorizing Provider  aspirin 81 MG chewable tablet Chew 1 tablet (81 mg total) by mouth daily. 08/07/18   Katha Hamming, MD  atorvastatin (LIPITOR) 40 MG tablet Take 1 tablet (40 mg total) by mouth daily at 6 PM. 08/07/18   Katha Hamming, MD  ibuprofen (ADVIL,MOTRIN) 600 MG tablet Take 1 tablet (600 mg total) by mouth every 8 (eight) hours as needed. 08/08/18   Merrily Brittle, MD  metoprolol tartrate (LOPRESSOR) 25 MG tablet Take 0.5 tablets (12.5 mg total) by mouth 2 (two) times daily. 08/07/18   Katha Hamming, MD  nitroGLYCERIN (NITROSTAT) 0.4 MG SL tablet Place 1 tablet (0.4 mg total) under the tongue every 5 (five) minutes as needed for chest pain. 08/08/18 08/08/19  Merrily Brittle, MD    Allergies Patient has no known allergies.  Family History  Problem Relation Age of Onset  . CAD Mother   . Diabetes Mother   . CAD Father   . Diabetes Father     Social History Social History   Tobacco Use  . Smoking status: Never Smoker  . Smokeless tobacco: Never Used  Substance Use Topics  . Alcohol use: No  Frequency: Never  . Drug use: Not on file    Review of Systems Constitutional: No fever/chills Eyes: No visual changes. ENT: No sore throat. Cardiovascular: Positive for chest pain. Respiratory: Positive for shortness of breath. Gastrointestinal: No abdominal pain.  No nausea, no vomiting.  No diarrhea.  No constipation. Genitourinary: Negative for dysuria. Musculoskeletal: Negative for back pain. Skin: Negative for rash. Neurological: Positive for headache   ____________________________________________   PHYSICAL EXAM:  VITAL SIGNS: ED Triage Vitals  [08/08/18 2217]  Enc Vitals Group     BP (!) 153/93     Pulse Rate 64     Resp 20     Temp 98.1 F (36.7 C)     Temp Source Oral     SpO2 100 %     Weight 165 lb 9.1 oz (75.1 kg)     Height 5\' 5"  (1.651 m)     Head Circumference      Peak Flow      Pain Score 6     Pain Loc      Pain Edu?      Excl. in GC?     Constitutional: Alert and oriented x4 tearful and somewhat anxious appearing although nontoxic no diaphoresis Eyes: PERRL EOMI. Head: Atraumatic. Nose: No congestion/rhinnorhea. Mouth/Throat: No trismus Neck: No stridor.  Able to lie completely flat no JVD Cardiovascular: Normal rate, regular rhythm. Grossly normal heart sounds.  Good peripheral circulation. Respiratory: Normal respiratory effort.  No retractions. Lungs CTAB and moving good air Gastrointestinal: Soft nontender Musculoskeletal: No lower extremity edema   Neurologic:  Normal speech and language. No gross focal neurologic deficits are appreciated. Skin:  Skin is warm, dry and intact. No rash noted. Psychiatric: Mood and affect are normal. Speech and behavior are normal.    ____________________________________________   DIFFERENTIAL includes but not limited to  STEMI, non-STEMI, unstable angina, stable angina, aortic dissection, pulmonary embolism, anxiety reaction ____________________________________________   LABS (all labs ordered are listed, but only abnormal results are displayed)  Labs Reviewed  CBC - Abnormal; Notable for the following components:      Result Value   WBC 10.6 (*)    All other components within normal limits  TROPONIN I - Abnormal; Notable for the following components:   Troponin I 0.05 (*)    All other components within normal limits  BASIC METABOLIC PANEL  HEPATIC FUNCTION PANEL    Lab work reviewed by me shows slightly elevated troponin which could represent new ischemia versus downtrending from previous  infarction __________________________________________  EKG  ED ECG REPORT I, Merrily Brittle, the attending physician, personally viewed and interpreted this ECG.  Date: 08/08/2018 EKG Time:  Rate: 64 Rhythm: normal sinus rhythm QRS Axis: normal Intervals: normal ST/T Wave abnormalities: normal Narrative Interpretation: no evidence of acute ischemia  ____________________________________________  RADIOLOGY  Chest x-ray reviewed by me with no acute disease noted ____________________________________________   PROCEDURES  Procedure(s) performed: no  Procedures  Critical Care performed: no  ____________________________________________   INITIAL IMPRESSION / ASSESSMENT AND PLAN / ED COURSE  Pertinent labs & imaging results that were available during my care of the patient were reviewed by me and considered in my medical decision making (see chart for details).   As part of my medical decision making, I reviewed the following data within the electronic MEDICAL RECORD NUMBER History obtained from family if available, nursing notes, old chart and ekg, as well as notes from prior ED visits.  Patient is currently pain-free at  rest aside from a mild headache.  She had a cardiac catheterization 2 days ago that showed no lesions which are amenable to PCI.  She was not discharged on any nitroglycerin.  She was given 10 mg of IV Compazine and 25 mg of IV Benadryl with near complete resolution of her headache.  I discussed with the patient her results and the importance of keeping her cardiology follow-up as scheduled.  I will prescribe her nitroglycerin for her angina.  Return precautions have been given.      ____________________________________________   FINAL CLINICAL IMPRESSION(S) / ED DIAGNOSES  Final diagnoses:  Stable angina pectoris (HCC)  Nonintractable headache, unspecified chronicity pattern, unspecified headache type      NEW MEDICATIONS STARTED DURING THIS  VISIT:  Discharge Medication List as of 08/08/2018 11:57 PM    START taking these medications   Details  ibuprofen (ADVIL,MOTRIN) 600 MG tablet Take 1 tablet (600 mg total) by mouth every 8 (eight) hours as needed., Starting Fri 08/08/2018, Print    nitroGLYCERIN (NITROSTAT) 0.4 MG SL tablet Place 1 tablet (0.4 mg total) under the tongue every 5 (five) minutes as needed for chest pain., Starting Fri 08/08/2018, Until Sat 08/08/2019, Print         Note:  This document was prepared using Dragon voice recognition software and may include unintentional dictation errors.     Merrily Brittle, MD 08/09/18 [phone]

## 2018-08-08 NOTE — Discharge Instructions (Signed)
Fortunately today your EKG, your chest x-ray, and your lab work were reassuring.  Please follow-up with your cardiologist on November 13 as scheduled and return to the emergency department sooner for any concerns whatsoever.  It was a pleasure to take care of you today, and thank you for coming to our emergency department.  If you have any questions or concerns before leaving please ask the nurse to grab me and I'm more than happy to go through your aftercare instructions again.  If you were prescribed any opioid pain medication today such as Norco, Vicodin, Percocet, morphine, hydrocodone, or oxycodone please make sure you do not drive when you are taking this medication as it can alter your ability to drive safely.  If you have any concerns once you are home that you are not improving or are in fact getting worse before you can make it to your follow-up appointment, please do not hesitate to call [phone] and come back for further evaluation.  Merrily Brittle, MD  Results for orders placed or performed during the hospital encounter of 08/08/18  CBC  Result Value Ref Range   WBC 10.6 (H) 4.0 - 10.5 K/uL   RBC 4.47 3.87 - 5.11 MIL/uL   Hemoglobin 14.2 12.0 - 15.0 g/dL   HCT 40.9 81.1 - 91.4 %   MCV 96.4 80.0 - 100.0 fL   MCH 31.8 26.0 - 34.0 pg   MCHC 32.9 30.0 - 36.0 g/dL   RDW 78.2 95.6 - 21.3 %   Platelets 251 150 - 400 K/uL   nRBC 0.0 0.0 - 0.2 %  Basic metabolic panel  Result Value Ref Range   Sodium 140 135 - 145 mmol/L   Potassium 4.5 3.5 - 5.1 mmol/L   Chloride 109 98 - 111 mmol/L   CO2 22 22 - 32 mmol/L   Glucose, Bld 97 70 - 99 mg/dL   BUN 15 6 - 20 mg/dL   Creatinine, Ser 0.86 0.44 - 1.00 mg/dL   Calcium 9.3 8.9 - 57.8 mg/dL   GFR calc non Af Amer >60 >60 mL/min   GFR calc Af Amer >60 >60 mL/min   Anion gap 9 5 - 15  Troponin I  Result Value Ref Range   Troponin I 0.05 (HH) <0.03 ng/mL  Hepatic function panel  Result Value Ref Range   Total Protein 7.3 6.5 - 8.1 g/dL   Albumin 4.1 3.5 - 5.0 g/dL   AST 18 15 - 41 U/L   ALT 14 0 - 44 U/L   Alkaline Phosphatase 93 38 - 126 U/L   Total Bilirubin 0.8 0.3 - 1.2 mg/dL   Bilirubin, Direct 0.2 0.0 - 0.2 mg/dL   Indirect Bilirubin 0.6 0.3 - 0.9 mg/dL   Dg Chest 2 View  Result Date: 08/08/2018 CLINICAL DATA:  52 year old female with chest pain. EXAM: CHEST - 2 VIEW COMPARISON:  Chest radiograph dated 08/05/2018 FINDINGS: The heart size and mediastinal contours are within normal limits. Both lungs are clear. The visualized skeletal structures are unremarkable. IMPRESSION: No active cardiopulmonary disease. Electronically Signed   By: Elgie Collard M.D.   On: 08/08/2018 23:06   Dg Chest 2 View  Result Date: 08/05/2018 CLINICAL DATA:  Chest pain, hypertension EXAM: CHEST - 2 VIEW COMPARISON:  Chest x-ray of 08/13/2017 FINDINGS: No active infiltrate or effusion is seen. Mediastinal and hilar contours are unremarkable. The heart is within upper limits of normal. No bony abnormality is seen. IMPRESSION: No active cardiopulmonary disease. Electronically Signed  By: Dwyane Dee M.D.   On: 08/05/2018 17:17

## 2018-08-08 NOTE — Telephone Encounter (Signed)
Patient contacted regarding discharge from [facility]  08/05/2018 - 08/07/2018 (2 days)  Patient understands to follow up with provider Nov 14th with Ward Givens  at [facility] office. Patient understands discharge instructions? YES Patient understands medications and regiment? YES Patient understands to bring all medications to this visit? YES  PT STATES (THRU TRANSLATOR -DAUGHTER ALONDRA) SHE IS FEELING POSITIONAL NAUSEA AND DIZZINESS. ALSO SHE STATES THAT SHE GETS SOB WHEN SHE IS  LYING ON HER LEFT SIDE. SHE STATES THAT SHE WILL GO TO THE ER OR CB SHE THESE SX WORSEN.

## 2018-08-11 NOTE — Telephone Encounter (Signed)
Called and left message for patient to call back using [facility] Interpreter ID # [phone].

## 2018-08-11 NOTE — Telephone Encounter (Signed)
Symptoms are atypical.  If she does not have chest pain or worsening shortness of breath but remains nauseated, she should see her PCP.  If chest pain recurs or her shortness of breath worsens, she should go to the ED.  Also, can we try to move up her follow-up appointment with Lavada Mesi, or me to later this week or early next week?  Thanks.  Yvonne Kendall, MD [facility] Pager: [phone]

## 2018-08-18 ENCOUNTER — Encounter: Payer: Self-pay | Admitting: Internal Medicine

## 2018-08-18 NOTE — Progress Notes (Signed)
Follow-up Outpatient Visit Date: 08/20/2018  Primary Care Provider: Inc, [facility] [address] [place] [address]  Chief Complaint: Follow-up chest pain and shortness of breath  HPI:  [name] is a 52 y.o. 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  History is obtained with the assistance of a Spanish interpreter.  She presented to the [facility] ED on 08/05/2018 with chest pain that began after sitting watching her dog get hit by a car.  In the ED, she was noted to have a mild troponin elevation.  Cardiac catheterization revealed pruning of the distal portions of D1 and OM3.  Echo showed normal LVEF.  Medical therapy was recommended.  She returned to the ED 2 days later with persistent chest pain and shortness of breath her troponin was minimally elevated at 0.05 but downtrending compared with 2 days earlier.  She was given a prescription for sublingual nitroglycerin and encouraged to follow-up with [place].  Today, [name] reports that she has been doing better since leaving the hospital.  She has not had any further chest pain similar to what brought her to the hospital.  She notices increased fatigue and dyspnea with activity.  She also endorses shortness of breath when lying on her left side in bed.  This seems to be associated with pain and congestion in her nose.  She has occasional dizziness as well as some palpitations when she feels short of breath.  She is tolerating her regimen of aspirin, atorvastatin, and metoprolol well.  She denies edema.  --------------------------------------------------------------------------------------------------  Past Medical History:  Diagnosis Date  . Dysmenorrhea   . Menorrhagia   . 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  Current Meds  Medication Sig  . aspirin 81 MG chewable tablet Chew 1 tablet (81 mg total) by mouth daily.  Marland Kitchen atorvastatin (LIPITOR) 40 MG tablet Take 1 tablet (40 mg total) by mouth daily at 6 PM.  . ibuprofen (ADVIL,MOTRIN) 600 MG tablet Take 1 tablet (600 mg total) by mouth every 8 (eight) hours as needed.  . metoprolol tartrate (LOPRESSOR) 25 MG tablet Take 0.5 tablets (12.5 mg total) by mouth 2 (two) times daily.  . nitroGLYCERIN (NITROSTAT) 0.4 MG SL tablet Place 1 tablet (0.4 mg total) under the tongue every 5 (five) minutes as needed for chest pain.    Allergies: Patient has no known allergies.  Social History   Tobacco Use  . Smoking status: Never Smoker  . Smokeless tobacco: Never Used  Substance Use Topics  . Alcohol use: No    Frequency: Never  . Drug use: Not on file    Family History  Problem Relation Age of Onset  . CAD Mother   .  Diabetes Mother   . Heart attack Mother   . CAD Father   . Diabetes Father   . Heart attack Father     Review of Systems: Mild soreness noted in the right forearm status post radial catheterization.  A 12-system review of systems was performed and was negative except as noted in the HPI.  --------------------------------------------------------------------------------------------------  Physical Exam: BP 132/86 (BP Location: Right Arm, Patient Position: Sitting, Cuff Size: Normal)   Pulse 60    Ht 5\' 5"  (1.651 m)   Wt 171 lb 8 oz (77.8 kg)   BMI 28.54 kg/m   General: NAD. HEENT: No conjunctival pallor or scleral icterus. Moist mucous membranes.  OP clear. Neck: Supple without lymphadenopathy, thyromegaly, JVD, or HJR. Lungs: Normal work of breathing. Clear to auscultation bilaterally without wheezes or crackles. Heart: Regular rate and rhythm without murmurs, rubs, or gallops. Non-displaced PMI. Abd: Bowel sounds present. Soft, NT/ND without hepatosplenomegaly Ext: No lower extremity edema. Radial, PT, and DP pulses are 2+ bilaterally.  Slight bruising noted in the right forearm.  Right radial arteriotomy site is well-healed. Skin: Warm and dry without rash.  EKG: Normal sinus rhythm without abnormalities.  Lab Results  Component Value Date   WBC 10.6 (H) 08/08/2018   HGB 14.2 08/08/2018   HCT 43.1 08/08/2018   MCV 96.4 08/08/2018   PLT 251 08/08/2018    Lab Results  Component Value Date   NA 140 08/08/2018   K 4.5 08/08/2018   CL 109 08/08/2018   CO2 22 08/08/2018   BUN 15 08/08/2018   CREATININE 0.72 08/08/2018   GLUCOSE 97 08/08/2018   ALT 14 08/08/2018    Lab Results  Component Value Date   CHOL 185 08/06/2018   HDL 40 (L) 08/06/2018   LDLCALC 118 (H) 08/06/2018   TRIG 133 08/06/2018   CHOLHDL 4.6 08/06/2018    --------------------------------------------------------------------------------------------------  ASSESSMENT AND PLAN: NSTEMI secondary to spontaneous coronary artery dissection Patient still notes some shortness of breath but no further chest pain reminiscent of what brought her to the emergency department.  I suspect her dyspnea is multifactorial, including cardia myopathy, deconditioning, and medication effects.  I will refer her to cardiac rehab.  If she continues to have exertional dyspnea, we may need to consider switching to a different agent.  I will obtain renal artery Doppler to screen for fibromuscular dysplasia, given association  with SCAD.  We will continue indefinite aspirin and statin therapy.  Hyperlipidemia Continue atorvastatin 40 mg daily.  Patient will need repeat lipid panel and LFTs in 2 to 3 months.  Follow-up: Return to clinic in 6 weeks peer  Yvonne Kendall, MD 08/20/2018 3:40 PM

## 2018-08-19 NOTE — Telephone Encounter (Signed)
No call back from the patient. She is scheduled to follow up with Dr. Okey DupreEnd on 08/20/18.

## 2018-08-20 ENCOUNTER — Encounter: Payer: Self-pay | Admitting: Internal Medicine

## 2018-08-20 ENCOUNTER — Ambulatory Visit (INDEPENDENT_AMBULATORY_CARE_PROVIDER_SITE_OTHER): Payer: Self-pay | Admitting: Internal Medicine

## 2018-08-20 VITALS — BP 132/86 | HR 60 | Ht 65.0 in | Wt 171.5 lb

## 2018-08-20 DIAGNOSIS — I2542 Coronary artery dissection: Secondary | ICD-10-CM

## 2018-08-20 DIAGNOSIS — I214 Non-ST elevation (NSTEMI) myocardial infarction: Secondary | ICD-10-CM

## 2018-08-20 DIAGNOSIS — E785 Hyperlipidemia, unspecified: Secondary | ICD-10-CM

## 2018-08-20 NOTE — Patient Instructions (Addendum)
Medication Instructions:  Your physician recommends that you continue on your current medications as directed. Please refer to the Current Medication list given to you today.  If you need a refill on your cardiac medications before your next appointment, please call your pharmacy.   Lab work: No new lab work today  If you have labs (blood work) drawn today and your tests are completely normal, you will receive your results only by: Marland Kitchen. MyChart Message (if you have MyChart) OR . A paper copy in the mail If you have any lab test that is abnormal or we need to change your treatment, we will call you to review the results.  Testing/Procedures: Your physician has requested that you have a renal artery duplex. During this test, an ultrasound is used to evaluate blood flow to the kidneys. Allow one hour for this exam. Do not eat after midnight the day before and avoid carbonated beverages. Take your medications as you usually do.   No food after 11PM the night before.  Water is OK. (Don't drink liquids if you have been instructed not to for ANOTHER test).   Take two Extra-Strength Gas-X capsules at bedtime the night before test.   Take an additional two Extra-Strength Gas-X capsules three (3) hours before the test or first thing in the morning.    Avoid foods that produce bowel gas, for 24 hours prior to exam (see below).    No breakfast, no chewing gum, no smoking or carbonated beverages.  Patient may take morning medications with water.  Come in for test at least 15 minutes early to register.    Follow-Up: You have been referred [facility]. Dr End would like you to start 1 month after discharge from the hospital around the first week of December.  You may call [phone] to initiate the process and make appointments if you have not heard from anyone in the next week.    At [facility], you and your health needs are our priority.  As part of our continuing mission to  provide you with exceptional heart care, we have created designated Provider Care Teams.  These Care Teams include your primary Cardiologist (physician) and Advanced Practice Providers (APPs -  Physician Assistants and Nurse Practitioners) who all work together to provide you with the care you need, when you need it. You will need a follow up appointment in 6 weeks.  Please call our office 2 months in advance to schedule this appointment.  You may see Dr. Cristal Deerhristopher End or one of the following Advanced Practice Providers on your designated Care Team:   Nicolasa Duckinghristopher Berge, NP Eula Listenyan Dunn, PA-C . Marisue IvanJacquelyn Visser, PA-C

## 2018-08-21 ENCOUNTER — Encounter: Payer: Self-pay | Admitting: Internal Medicine

## 2018-08-21 ENCOUNTER — Ambulatory Visit: Payer: Self-pay | Admitting: Nurse Practitioner

## 2018-08-21 DIAGNOSIS — E785 Hyperlipidemia, unspecified: Secondary | ICD-10-CM | POA: Insufficient documentation

## 2018-08-21 DIAGNOSIS — I2542 Coronary artery dissection: Secondary | ICD-10-CM | POA: Insufficient documentation

## 2018-08-27 ENCOUNTER — Ambulatory Visit (INDEPENDENT_AMBULATORY_CARE_PROVIDER_SITE_OTHER): Payer: Self-pay

## 2018-08-27 DIAGNOSIS — I2542 Coronary artery dissection: Secondary | ICD-10-CM

## 2018-08-28 ENCOUNTER — Ambulatory Visit: Payer: Self-pay | Admitting: Physician Assistant

## 2018-09-01 ENCOUNTER — Telehealth: Payer: Self-pay | Admitting: *Deleted

## 2018-09-01 NOTE — Telephone Encounter (Signed)
-----   Message from Elaine Kendallhristopher End, MD sent at 08/27/2018  2:49 PM EST ----- Please let the patient know that her renal artery Doppler is normal.  She should continue her current medications and f/u as previously discussed.

## 2018-09-01 NOTE — Telephone Encounter (Signed)
Attempted to reach patient using [employer] number [phone]. No answer. Left message with results and upcoming appointment date and time and to call back if any further questions.

## 2018-09-19 ENCOUNTER — Ambulatory Visit (INDEPENDENT_AMBULATORY_CARE_PROVIDER_SITE_OTHER): Payer: Self-pay | Admitting: Nurse Practitioner

## 2018-09-19 ENCOUNTER — Encounter: Payer: Self-pay | Admitting: Nurse Practitioner

## 2018-09-19 VITALS — BP 128/80 | HR 67 | Ht 65.0 in | Wt 172.0 lb

## 2018-09-19 DIAGNOSIS — I2542 Coronary artery dissection: Secondary | ICD-10-CM

## 2018-09-19 DIAGNOSIS — E785 Hyperlipidemia, unspecified: Secondary | ICD-10-CM

## 2018-09-19 NOTE — Progress Notes (Signed)
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  Past Medical History    Past Medical History:  Diagnosis Date  . Diastolic dysfunction    a. 07/2018 Echo: EF 55-60%, no rwma, Gr1 DD, mildly dil LA. Nl RV size/fxn.  Marland Kitchen Dysmenorrhea   . Hyperlipidemia LDL goal <70   . Menorrhagia   . Spontaneous dissection of coronary artery    a. 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   Past Surgical History:  Procedure Laterality Date  . ABDOMINAL HYSTERECTOMY    . CHOLECYSTECTOMY    . HERNIA REPAIR    . LEFT HEART CATH AND CORONARY ANGIOGRAPHY N/A 08/06/2018   Procedure: LEFT HEART CATH AND CORONARY ANGIOGRAPHY;  Surgeon: Yvonne Kendall, MD;  Location: [facility] INVASIVE CV LAB;  Service: Cardiovascular;  Laterality: N/A;    Allergies  No Known Allergies  History of Present Illness    52 year old female with a history of spontaneous coronary artery dissection and non-STEMI in October, at which time she presented with chest pain after seeing her dog get hit by a car.  In the emergency department, she had mild troponin elevation.  She eventually underwent diagnostic catheterization which revealed pruning of the distal portions of the D1 and OM 3, consistent with spontaneous coronary artery dissection.  Echocardiogram showed normal LV function.  Medical therapy was recommended.  She returned to the ED 2 days later with persistent chest pain and dyspnea and troponin was minimally elevated and  downtrending from prior admission.  She was last seen in clinic on November 13, at which time she reported fatigue and dyspnea with activity.  Continued medical therapy was recommended and she subsequently underwent renal artery Doppler to assess for fibromuscular dysplasia.  This showed normal kidneys bilaterally without evidence for renal artery stenosis.  Since her last visit, she stopped taking all of her medicines about a week ago.  She did this because she had continued to feel fatigue and some dyspnea on exertion.  After stopping her medicines, she noticed significant improvement in exercise tolerance and has since been walking for an hour every day without chest pain or dyspnea.  She does occasionally have brief episodes of mild left-sided chest discomfort but overall, the symptom has improved dramatically and is not occurring with exertion.  She denies PND, orthopnea, dizziness, syncope, edema, or early satiety.  She hopes to participate in cardiac rehabilitation but does not currently have insurance.  She meets with a financial counselor next Tuesday.  Home Medications    Prior to Admission medications   Medication Sig Start Date End Date Taking? Authorizing Provider  aspirin 81 MG chewable tablet Chew 1 tablet (81 mg total) by mouth daily. 08/07/18  Yes Katha Hamming, MD  atorvastatin (LIPITOR) 40 MG tablet Take 1 tablet (40 mg total) by mouth daily at 6 PM. 08/07/18  Yes Katha Hamming, MD  ibuprofen (ADVIL,MOTRIN) 600 MG tablet Take 1 tablet (600 mg total) by mouth every 8 (eight) hours as needed. 08/08/18  Yes Merrily Brittle, MD  metoprolol tartrate (LOPRESSOR) 25 MG tablet Take 0.5 tablets (12.5 mg total) by mouth 2 (two) times daily. 08/07/18  Yes Katha HammingKonidena, Snehalatha, MD  nitroGLYCERIN (NITROSTAT) 0.4 MG SL tablet Place 1 tablet (0.4 mg total) under the tongue every 5 (five) minutes as needed for chest pain. 08/08/18 08/08/19 Yes Merrily Brittleifenbark, Neil, MD    Review of Systems     Occasional mild chest pressure but overall improved.  Now walking 1 hour daily without significant limitations.  She denies palpitations, PND, orthopnea, dizziness, syncope, edema, dyspnea, or early satiety.  All other systems reviewed and are otherwise negative except as noted above.  Physical Exam    VS:  BP 128/80 (BP Location: Left Arm, Patient Position: Sitting, Cuff Size: Normal)   Pulse 67   Ht 5\' 5"  (1.651 m)   Wt 172 lb (78 kg)   BMI 28.62 kg/m  , BMI Body mass index is 28.62 kg/m. GEN: Well nourished, well developed, in no acute distress. HEENT: normal. Neck: Supple, no JVD, carotid bruits, or masses. Cardiac: RRR, no murmurs, rubs, or gallops. No clubbing, cyanosis, edema.  Radials/DP/PT 2+ and equal bilaterally.  Respiratory:  Respirations regular and unlabored, clear to auscultation bilaterally. GI: Soft, nontender, nondistended, BS + x 4. MS: no deformity or atrophy. Skin: warm and dry, no rash. Neuro:  Strength and sensation are intact. Psych: Normal affect.  Accessory Clinical Findings    ECG personally reviewed by me today -regular sinus rhythm, 67- no acute changes.  Assessment & Plan    1.  Spontaneous coronary artery dissection: Status post admission in late October with chest pain and troponin elevation/non-STEMI.  Catheterization revealed spontaneous coronary artery dissection of the diagonal and OM 3.  Echo showed normal LV function.  She has been medically managed.  She came off of all of her medicines about a week ago in the setting of fatigue and she has noted that fatigue has improved.  We discussed resumption of medications and she agreed to go back on aspirin and statin therapy.  I suspect the beta-blocker was the primary culprit for her fatigue.  She is continuing to consider cardiac rehabilitation but does not currently have insurance.  She meets with a financial counselor next Tuesday.  2.  Hyperlipidemia: She will resume statin therapy at our  direction.  Plan to follow-up lipids and LFTs at next office follow-up in 8 weeks.  3.  Disposition: Follow-up in 8 weeks.   Nicolasa Duckinghristopher Rice Walsh, NP 09/19/2018, 4:55 PM

## 2018-09-19 NOTE — Patient Instructions (Signed)
Medication Instructions:  Your physician has recommended you make the following change in your medication:  1- Stop metoprolol today   If you need a refill on your cardiac medications before your next appointment, please call your pharmacy.   Lab work: Your physician recommends that you return for lab work in: 2 months   If you have labs (blood work) drawn today and your tests are completely normal, you will receive your results only by: Marland Kitchen. MyChart Message (if you have MyChart) OR . A paper copy in the mail If you have any lab test that is abnormal or we need to change your treatment, we will call you to review the results.  Testing/Procedures: None ordered   Follow-Up: At [facility], you and your health needs are our priority.  As part of our continuing mission to provide you with exceptional heart care, we have created designated Provider Care Teams.  These Care Teams include your primary Cardiologist (physician) and Advanced Practice Providers (APPs -  Physician Assistants and Nurse Practitioners) who all work together to provide you with the care you need, when you need it. You will need a follow up appointment in 2 months.   You may see Yvonne Kendallhristopher End, MD or one of the following Advanced Practice Providers on your designated Care Team:   Nicolasa Duckinghristopher Berge, NP Eula Listenyan Dunn, PA-C . Marisue IvanJacquelyn Visser, PA-C

## 2018-11-26 ENCOUNTER — Ambulatory Visit: Payer: Self-pay | Admitting: Nurse Practitioner

## 2018-11-28 ENCOUNTER — Encounter: Payer: Self-pay | Admitting: Nurse Practitioner

## 2018-11-28 ENCOUNTER — Ambulatory Visit (INDEPENDENT_AMBULATORY_CARE_PROVIDER_SITE_OTHER): Payer: Self-pay | Admitting: Nurse Practitioner

## 2018-11-28 VITALS — BP 116/80 | HR 64 | Ht 65.0 in | Wt 173.0 lb

## 2018-11-28 DIAGNOSIS — I2542 Coronary artery dissection: Secondary | ICD-10-CM

## 2018-11-28 DIAGNOSIS — E782 Mixed hyperlipidemia: Secondary | ICD-10-CM

## 2018-11-28 DIAGNOSIS — R0600 Dyspnea, unspecified: Secondary | ICD-10-CM

## 2018-11-28 MED ORDER — ATORVASTATIN CALCIUM 40 MG PO TABS: 40.0000 mg | ORAL_TABLET | Freq: Every day | ORAL | 11 refills | Status: DC

## 2018-11-28 MED ORDER — MECLIZINE HCL 25 MG PO TABS: 25.0000 mg | ORAL_TABLET | Freq: Three times a day (TID) | ORAL | 6 refills | Status: DC | PRN

## 2018-11-28 NOTE — Patient Instructions (Addendum)
Medication Instructions:  Your physician has recommended you make the following change in your medication:  1- START Lipitor Take 1 tablet (40 mg total) by mouth daily at 6 PM 2- START Meclizine Take 1 tablet (25 mg total) by mouth 3 (three) times daily as needed for dizziness or nausea  If you need a refill on your cardiac medications before your next appointment, please call your pharmacy.   Lab work: Your physician recommends that you return for lab work in: [facility] mall. You will need to be fasting.  No appt is needed. Hours are M-F 7AM- 6 PM.  If you have labs (blood work) drawn today and your tests are completely normal, you will receive your results only by: Marland Kitchen MyChart Message (if you have MyChart) OR . A paper copy in the mail If you have any lab test that is abnormal or we need to change your treatment, we will call you to review the results.  Testing/Procedures: 1- Echo Echo  Please return to [facility] on ______________ at _______________ AM/PM for an Echocardiogram. Your physician has requested that you have an echocardiogram. Echocardiography is a painless test that uses sound waves to create images of your heart. It provides your doctor with information about the size and shape of your heart and how well your heart's chambers and valves are working. This procedure takes approximately one hour. There are no restrictions for this procedure. Please note; depending on visual quality an IV may need to be placed.    Follow-Up: At [facility], you and your health needs are our priority.  As part of our continuing mission to provide you with exceptional heart care, we have created designated Provider Care Teams.  These Care Teams include your primary Cardiologist (physician) and Advanced Practice Providers (APPs -  Physician Assistants and Nurse Practitioners) who all work together to provide you with the care you need, when you need it. You will need  a follow up appointment in 3 months.  You may see Yvonne Kendall, MD or Nicolasa Ducking, NP.

## 2018-11-28 NOTE — Progress Notes (Signed)
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  Past Medical History    Past Medical History:  Diagnosis Date  . Diastolic dysfunction    a. 07/2018 Echo: EF 55-60%, no rwma, Gr1 DD, mildly dil LA. Nl RV size/fxn.  Marland Kitchen Dysmenorrhea   . Hyperlipidemia LDL goal <70   . Menorrhagia   . Spontaneous dissection of coronary artery    a. 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   Past Surgical History:  Procedure Laterality Date  . ABDOMINAL HYSTERECTOMY    . CHOLECYSTECTOMY    . HERNIA REPAIR    . LEFT HEART CATH AND CORONARY ANGIOGRAPHY N/A 08/06/2018   Procedure: LEFT HEART CATH AND CORONARY ANGIOGRAPHY;  Surgeon: Yvonne Kendall, MD;  Location: [facility] INVASIVE CV LAB;  Service: Cardiovascular;  Laterality: N/A;    Allergies  No Known Allergies  History of Present Illness    53 year old female with a history of spontaneous coronary artery dissection and non-STEMI in October 2019, at which time she presented with chest pain after seeing her dog struck by a car.  In the emergency department, she had mild troponin elevation.  She eventually underwent diagnostic catheterization which revealed pruning of the distal portions of the first diagonal and third obtuse marginal, consistent with spontaneous coronary artery dissection.  Echocardiogram showed normal LV function.  Medical therapy was initiated with aspirin, beta-blocker, and statin therapy.  She was experiencing  fatigue and dyspnea following discharge and she discontinued all of her medications initially.  Renal arterial Doppler was performed to assess for fibromuscular dysplasia and was negative.  She was last seen in clinic in December 2019, at which time she was advised to resume aspirin and statin therapy.  At that time, she had improvement in exercise tolerance and was walking an hour a day.  Since her last visit, she has noted intermittent dizziness with a sense as though the room is spinning, especially with certain head movements.  She is also noted a slight reduction in exercise tolerance and has been experiencing dyspnea on exertion after walking for about 15 minutes.  She has not had any significant chest pain.  She denies PND, orthopnea, syncope, edema, or early satiety.  She also notes that she has not resumed Lipitor because she did not have any at home.  She would be willing to resume if she had a prescription.  Home Medications    Prior to Admission medications   Medication Sig Start Date End Date Taking? Authorizing Provider  aspirin 81 MG chewable tablet Chew 1 tablet (81 mg total) by mouth daily. 08/07/18  Yes Katha Hamming, MD  nitroGLYCERIN (NITROSTAT) 0.4 MG SL tablet Place 1 tablet (0.4 mg total) under the tongue every 5 (five) minutes as needed for chest pain. 08/08/18 08/08/19 Yes Merrily Brittle, MD  atorvastatin (LIPITOR) 40 MG tablet Take 1 tablet (40 mg total) by mouth daily at 6 PM. Patient not taking: Reported on 11/28/2018 08/07/18   Katha Hamming, MD    Review of Systems  Dizziness and dyspnea on exertion as outlined above.  She denies chest pain, palpitations, PND, orthopnea, syncope, edema, or early satiety.  All other systems reviewed and are otherwise negative except as noted above.  Physical Exam    VS:  BP 116/80 (BP Location: Left Arm, Patient Position: Sitting, Cuff Size: Normal)   Pulse 64   Ht 5\' 5"  (1.651 m)   Wt 173 lb (78.5 kg)   BMI 28.79  kg/m  , BMI Body mass index is 28.79 kg/m. GEN: Well nourished, well developed, in no acute distress. HEENT: normal. Neck: Supple, no JVD, carotid bruits, or masses. Cardiac: RRR, no murmurs, rubs, or gallops. No clubbing, cyanosis, edema.  Radials/DP/PT 2+ and equal bilaterally.  Respiratory:  Respirations regular and unlabored, clear to auscultation bilaterally. GI: Soft, nontender, nondistended, BS + x 4. MS: no deformity or atrophy. Skin: warm and dry, no rash. Neuro:  Strength and sensation are intact. Psych: Normal affect.  Accessory Clinical Findings    ECG personally reviewed by me today -regular sinus rhythm, 64- no acute changes.  Assessment & Plan    1.  Spontaneous coronary artery dissection: Status post admission in late October 2019 with chest pain and troponin elevation/non-STEMI.  Catheterization revealed spontaneous coronary dissection of the first diagonal and third obtuse marginal.  She has not been having any significant chest pain since her last visit but does note a return of reduction in exercise tolerance and dyspnea on exertion after walking about 15 minutes.  I will follow-up an echocardiogram.  She did not tolerate beta-blocker therapy secondary to fatigue.  She is currently taking aspirin.  At her last visit, I recommended resumption of atorvastatin.  She now tells [place] that she did not have any at that time.  We will go ahead and refill with a plan to follow-up lipids and LFTs in approximately 6 weeks.  2.  Dyspnea on exertion: Euvolemic on examination.  Follow-up echocardiogram as above.  She has not been having any significant chest pain.  3.  Hyperlipidemia: Resuming statin therapy.  Follow-up lipids and LFTs in approximately 6 weeks.  4.  Dizziness/vertigo: Patient has been experiencing significant dizziness with the sensation that the room is spinning when she turns her head.  She has not tried meclizine.  I will prescribe meclizine 25 mg 3 times daily as  needed for dizziness.  5.  Disposition: Follow-up echocardiogram as well as lipids and LFTs in approximately 6 weeks.  Follow-up in clinic in 3 months or sooner if necessary.  Nicolasa Ducking, NP 11/28/2018, 2:39 PM

## 2018-12-08 ENCOUNTER — Ambulatory Visit: Payer: Self-pay

## 2018-12-08 ENCOUNTER — Ambulatory Visit (INDEPENDENT_AMBULATORY_CARE_PROVIDER_SITE_OTHER): Payer: Self-pay

## 2018-12-08 ENCOUNTER — Encounter: Payer: Self-pay | Admitting: *Deleted

## 2018-12-08 DIAGNOSIS — R0600 Dyspnea, unspecified: Secondary | ICD-10-CM

## 2019-01-30 ENCOUNTER — Telehealth: Payer: Self-pay | Admitting: *Deleted

## 2019-01-30 NOTE — Telephone Encounter (Signed)
Virtual Visit Pre-Appointment Phone Call  "(Name), I am calling you today to discuss your upcoming appointment. We are currently trying to limit exposure to the virus that causes COVID-19 by seeing patients at home rather than in the office."  1. "What is the BEST phone number to call the day of the visit?" - include this in appointment notes  2. "Do you have or have access to (through a family member/friend) a smartphone with video capability that we can use for your visit?" a. If yes - list this number in appt notes as "cell" (if different from BEST phone #) and list the appointment type as a VIDEO visit in appointment notes b. If no - list the appointment type as a PHONE visit in appointment notes  Confirm consent - "In the setting of the current Covid19 crisis, you are scheduled for a (TELEPHONE) visit with your provider on (02/06/2019) at (2:30 PM).  Just as we do with many in-office visits, in order for you to participate in this visit, we must obtain consent.  If you'd like, I can send this to your mychart (if signed up) or email for you to review.  Otherwise, I can obtain your verbal consent now.  All virtual visits are billed to your insurance company just like a normal visit would be.  By agreeing to a virtual visit, we'd like you to understand that the technology does not allow for your provider to perform an examination, and thus may limit your provider's ability to fully assess your condition. If your provider identifies any concerns that need to be evaluated in person, we will make arrangements to do so.  Finally, though the technology is pretty good, we cannot assure that it will always work on either your or our end, and in the setting of a video visit, we may have to convert it to a phone-only visit.  In either situation, we cannot ensure that we have a secure connection.  Are you willing to proceed?" YES 3. Advise patient to be prepared - "Two hours prior to your appointment, go  ahead and check your blood pressure, pulse, oxygen saturation, and your weight (if you have the equipment to check those) and write them all down. When your visit starts, your provider will ask you for this information. If you have an Apple Watch or Kardia device, please plan to have heart rate information ready on the day of your appointment. Please have a pen and paper handy nearby the day of the visit as well."  4. Give patient instructions for MyChart download to smartphone OR Doximity/Doxy.me as below if video visit (depending on what platform provider is using)  5. Inform patient they will receive a phone call 15 minutes prior to their appointment time (may be from unknown caller ID) so they should be prepared to answer    TELEPHONE CALL NOTE  Elaine Fernandez has been deemed a candidate for a follow-up tele-health visit to limit community exposure during the Covid-19 pandemic. I spoke with the patient via phone to ensure availability of phone/video source, confirm preferred email & phone number, and discuss instructions and expectations.  I reminded Elaine Fernandez to be prepared with any vital sign and/or heart rhythm information that could potentially be obtained via home monitoring, at the time of her visit. I reminded Elaine Fernandez to expect a phone call prior to her visit.  Kendrick Fries, CMA 01/30/2019 2:29 PM   INSTRUCTIONS FOR DOWNLOADING THE  MYCHART APP TO SMARTPHONE  - The patient must first make sure to have activated MyChart and know their login information - If Apple, go to [employer] and type in MyChart in the search bar and download the app. If Android, ask patient to go to [employer] and type in [place] in the search bar and download the app. The app is free but as with any other app downloads, their phone may require them to verify saved payment information or Apple/Android password.  - The patient will need to then log into the app with their  MyChart username and password, and select [facility] as their healthcare provider to link the account. When it is time for your visit, go to the MyChart app, find appointments, and click Begin Video Visit. Be sure to Select Allow for your device to access the Microphone and Camera for your visit. You will then be connected, and your provider will be with you shortly.  **If they have any issues connecting, or need assistance please contact MyChart service desk [phone])**  **If using a computer, in order to ensure the best quality for their visit they will need to use either of the following Internet Browsers: [employer], or Google Chrome**  IF USING DOXIMITY or DOXY.ME - The patient will receive a link just prior to their visit by text.     FULL LENGTH CONSENT FOR TELE-HEALTH VISIT   I hereby voluntarily request, consent and authorize [facility] and its employed or contracted physicians, physician assistants, nurse practitioners or other licensed health care professionals (the Practitioner), to provide me with telemedicine health care services (the "Services") as deemed necessary by the treating Practitioner. I acknowledge and consent to receive the Services by the Practitioner via telemedicine. I understand that the telemedicine visit will involve communicating with the Practitioner through live audiovisual communication technology and the disclosure of certain medical information by electronic transmission. I acknowledge that I have been given the opportunity to request an in-person assessment or other available alternative prior to the telemedicine visit and am voluntarily participating in the telemedicine visit.  I understand that I have the right to withhold or withdraw my consent to the use of telemedicine in the course of my care at any time, without affecting my right to future care or treatment, and that the Practitioner or I may terminate the telemedicine visit at  any time. I understand that I have the right to inspect all information obtained and/or recorded in the course of the telemedicine visit and may receive copies of available information for a reasonable fee.  I understand that some of the potential risks of receiving the Services via telemedicine include:  Marland Kitchen Delay or interruption in medical evaluation due to technological equipment failure or disruption; . Information transmitted may not be sufficient (e.g. poor resolution of images) to allow for appropriate medical decision making by the Practitioner; and/or  . In rare instances, security protocols could fail, causing a breach of personal health information.  Furthermore, I acknowledge that it is my responsibility to provide information about my medical history, conditions and care that is complete and accurate to the best of my ability. I acknowledge that Practitioner's advice, recommendations, and/or decision may be based on factors not within their control, such as incomplete or inaccurate data provided by me or distortions of diagnostic images or specimens that may result from electronic transmissions. I understand that the practice of medicine is not an exact science and that Practitioner makes  no warranties or guarantees regarding treatment outcomes. I acknowledge that I will receive a copy of this consent concurrently upon execution via email to the email address I last provided but may also request a printed copy by calling the office of [place].    I understand that my insurance will be billed for this visit.   I have read or had this consent read to me. . I understand the contents of this consent, which adequately explains the benefits and risks of the Services being provided via telemedicine.  . I have been provided ample opportunity to ask questions regarding this consent and the Services and have had my questions answered to my satisfaction. . I give my informed consent for the  services to be provided through the use of telemedicine in my medical care  By participating in this telemedicine visit I agree to the above.

## 2019-02-03 NOTE — Progress Notes (Signed)
Virtual Visit via Telephone Note   This visit type was conducted due to national recommendations for restrictions regarding the COVID-19 Pandemic (e.g. social distancing) in an effort to limit this patient's exposure and mitigate transmission in our community.  Due to her co-morbid illnesses, this patient is at least at moderate risk for complications without adequate follow up.  This format is felt to be most appropriate for this patient at this time.  The patient did not have access to video technology/had technical difficulties with video requiring transitioning to audio format only (telephone).  All issues noted in this document were discussed and addressed.  No physical exam could be performed with this format.  Please refer to the patient's chart for her  consent to telehealth for [facility].   Evaluation Performed:  Follow-up visit  Date:  02/06/2019   ID:  Elaine Fernandez, DOB 01/18/1966, MRN [id number]  Patient Location: Home Provider Location: Office  PCP:  Resa Miner, MD  Cardiologist:  Yvonne Kendall, MD  Electrophysiologist:  None   Chief Complaint: Follow-up chest pain and shortness of breath  History of Present Illness:    Elaine Fernandez is a 53 y.o. 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  We are speaking today with assistance of a Spanish interpreter for follow-up of her coronary disease.  She was last seen by Ward Givens, NP, on 11/28/2018.  At that time she noted intermittent dizziness, worsened with particular head movements.  She also reported worsening exertional dyspnea.  Beta-blocker therapy was previously discontinued due to fatigue.  Echocardiogram was subsequently performed for evaluation of exertional dyspnea; this showed normal ventricular systolic/diastolic parameters without any significant valvular abnormality.   Today, the patient reports that her breathing is better.  She does not have any significant dyspnea at rest or with activity.  Her dizziness has also resolved.  She reports one episode of chest discomfort that occurred after receiving bad news about a family member approximately 1 month ago.  She took a single sublingual nitroglycerin with prompt resolution of the pain.  She otherwise has been without chest pain.  She is tolerating low-dose aspirin and atorvastatin well.  Overall, she feels better since discontinuing metoprolol.  The patient does not have symptoms concerning for COVID-19 infection (fever, chills, cough, or new shortness of breath).    Past Medical History:  Diagnosis Date  . Diastolic dysfunction    a. 07/2018 Echo: EF 55-60%, no rwma, Gr1 DD, mildly dil LA. Nl RV size/fxn.  Marland Kitchen Dysmenorrhea   . Hyperlipidemia LDL goal <70   . Menorrhagia   . Spontaneous dissection of coronary artery    a. 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   Past Surgical History:  Procedure Laterality Date  . ABDOMINAL HYSTERECTOMY    . CHOLECYSTECTOMY    . HERNIA REPAIR    . LEFT HEART CATH AND CORONARY ANGIOGRAPHY N/A 08/06/2018   Procedure: LEFT HEART CATH AND CORONARY ANGIOGRAPHY;  Surgeon: Yvonne Kendall, MD;  Location: [facility] INVASIVE CV LAB;  Service: Cardiovascular;  Laterality: N/A;     Current Meds  Medication Sig  . aspirin 81 MG chewable tablet Chew 1 tablet (81 mg total) by mouth daily.  Marland Kitchen atorvastatin (LIPITOR) 40 MG tablet Take 1 tablet (40 mg total) by mouth  daily at 6 PM.  . nitroGLYCERIN (NITROSTAT) 0.4 MG SL tablet Place 1 tablet (0.4 mg total) under the tongue every 5 (five) minutes as needed for chest pain.     Allergies:   Patient has no known allergies.   Social History   Tobacco Use  . Smoking status: Never Smoker  . Smokeless tobacco: Never Used   Substance Use Topics  . Alcohol use: No    Frequency: Never  . Drug use: Not on file     Family Hx: The patient's family history includes CAD in her father and mother; Diabetes in her father and mother; Heart attack in her father and mother.  ROS:   Please see the history of present illness.   All other systems reviewed and are negative.   Prior CV studies:   The following studies were reviewed today:  TTE (12/08/2018): Normal LV size.  LVEF 60-65% with normal wall motion and diastolic function.  Normal RV size and function.  No significant valvular abnormality.  Renal artery Doppler (08/27/2018): No evidence of renal artery stenosis.  Normal celiac artery and SMA findings.  TTE (08/06/2018): Normal LV size and wall thickness.  LVEF 55-60%.  Grade 1 diastolic dysfunction.  Mild left atrial enlargement.  Normal RV size and function.  LHC (08/06/2018): 1. Significant stenoses (90-99%) involving small branch of D1 and OM3, which likely represent culprit lesions for the patient's NSTEMI.  The large epicardial coronary arteries are angiographically normal.  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 2. Focal mid inferior hypokinesis with otherwise preserved left ventricular systolic function. 3. Mildly elevated left ventricular filling pressure.  Labs/Other Tests and Data Reviewed:    EKG:  No ECG reviewed.  Recent Labs: 08/05/2018: TSH 1.309 08/06/2018: Magnesium 2.1 08/08/2018: ALT 14; BUN 15; Creatinine, Ser 0.72; Hemoglobin 14.2; Platelets 251; Potassium 4.5; Sodium 140   Recent Lipid Panel Lab Results  Component Value Date/Time   CHOL 185 08/06/2018 06:34 AM   TRIG 133 08/06/2018 06:34 AM   HDL 40 (L) 08/06/2018 06:34 AM   CHOLHDL 4.6 08/06/2018 06:34 AM   LDLCALC 118 (H) 08/06/2018 06:34 AM    Wt Readings from Last 3 Encounters:  02/06/19 172 lb 4 oz (78.1 kg)  11/28/18 173 lb (78.5 kg)  09/19/18 172 lb (78 kg)      Objective:    Vital Signs:  Ht  (1.651 m)   Wt 172 lb 4 oz (78.1 kg)   BMI 28.66 kg/m    VITAL SIGNS:  reviewed  ASSESSMENT & PLAN:    Spontaneous coronary artery dissection: Patient is can recovered well from her NSTEMI last year due to scad.  Transient chest pain during emotionally stressful situation occurred about a month ago but resolved promptly with a single sublingual nitroglycerin.  I think it is fine for her to continue to use sublingual nitroglycerin as needed.  If she has more persistent pain, she should seek immediate medical attention.  We will continue with aspirin and atorvastatin.  Unfortunately, Elaine Fernandez was intolerant of metoprolol.  We will therefore forego retrial beta-blocker.  Hyperlipidemia: LDL noted to be mildly elevated at the time of NSTEMI last year.  Our goal LDL is less than 70.  Despite being ordered several times, repeat lipid panel and LFTs have yet to be collected.  I have reinforced the importance of monitoring her lipids and LFTs; we will plan to draw hepatic function panel and fasting lipid panel  when COVID-19 precautions have eased.  COVID-19 Education: The signs and symptoms of COVID-19 were discussed with the patient and how to seek care for testing (follow up with PCP or arrange E-visit).  The importance of social distancing was discussed today.  Time:   Today, I have spent 13 minutes with the patient with telehealth technology discussing the above problems.     Medication Adjustments/Labs and Tests Ordered: Current medicines are reviewed at length with the patient today.  Concerns regarding medicines are outlined above.   Tests Ordered: Lipid panel and hepatic function panel  Medication Changes: None.  Disposition:  Follow up in 6 month(s)  Signed, Yvonne Kendallhristopher Leith Hedlund, MD  02/06/2019 2:28 PM    [facility]

## 2019-02-06 ENCOUNTER — Telehealth: Payer: Self-pay | Admitting: *Deleted

## 2019-02-06 ENCOUNTER — Telehealth (INDEPENDENT_AMBULATORY_CARE_PROVIDER_SITE_OTHER): Payer: Self-pay | Admitting: Internal Medicine

## 2019-02-06 ENCOUNTER — Other Ambulatory Visit: Payer: Self-pay

## 2019-02-06 ENCOUNTER — Encounter: Payer: Self-pay | Admitting: Internal Medicine

## 2019-02-06 VITALS — Ht 65.0 in | Wt 172.2 lb

## 2019-02-06 DIAGNOSIS — E785 Hyperlipidemia, unspecified: Secondary | ICD-10-CM

## 2019-02-06 DIAGNOSIS — I2542 Coronary artery dissection: Secondary | ICD-10-CM

## 2019-02-06 NOTE — Patient Instructions (Addendum)
Medication Instructions:  Your physician recommends that you continue on your current medications as directed. Please refer to the Current Medication list given to you today.  If you need a refill on your cardiac medications before your next appointment, please call your pharmacy.   Lab work: Your physician recommends that you return for lab work in: at your earliest convenience at [facility]. (LIPID, LIVER). - You will need to be fasting; DO NOT EAT OR DRINK after midnight the morning you go. - You may only have water. - Please go to the [facility]. You will check in at the front desk to the right as you walk into the atrium. Valet Parking is offered if needed. - No appt is needed. Hours are M-F 7AM- 6 PM.    If you have labs (blood work) drawn today and your tests are completely normal, you will receive your results only by: Marland Kitchen MyChart Message (if you have MyChart) OR . A paper copy in the mail If you have any lab test that is abnormal or we need to change your treatment, we will call you to review the results.  Testing/Procedures: none  Follow-Up: At [facility], you and your health needs are our priority.  As part of our continuing mission to provide you with exceptional heart care, we have created designated Provider Care Teams.  These Care Teams include your primary Cardiologist (physician) and Advanced Practice Providers (APPs -  Physician Assistants and Nurse Practitioners) who all work together to provide you with the care you need, when you need it. You will need a follow up appointment in 6 months.  Please call our office 2 months in advance to schedule this appointment.  You may see Yvonne Kendall, MD or one of the following Advanced Practice Providers on your designated Care Team:   Nicolasa Ducking, NP Eula Listen, PA-C . Marisue Ivan, PA-C

## 2019-02-06 NOTE — Telephone Encounter (Signed)
Called patient Scientist, physiological ID # [id number]. Went over AVS from visit today including to get lab work at [facility] and to be fasting. She is also aware to call us 2 months in advance for follow up in 6 months.  AVS being mailed to patient.

## 2019-02-27 ENCOUNTER — Ambulatory Visit: Payer: Self-pay | Admitting: Internal Medicine

## 2019-04-01 ENCOUNTER — Ambulatory Visit: Payer: Self-pay | Attending: Oncology

## 2019-08-24 ENCOUNTER — Telehealth: Payer: Self-pay | Admitting: Internal Medicine

## 2019-08-24 NOTE — Telephone Encounter (Signed)
Routing to Dr End. 

## 2019-08-24 NOTE — Telephone Encounter (Signed)
Please call and advise if patient can be placed on estrogen for menopause given her h/o spontaneous coronary artery dissection.

## 2019-08-24 NOTE — Telephone Encounter (Signed)
I advise against hormone replacement therapy unless symptoms are intractable and significantly life-altering.  It looks like she is due for her 6 mo follow-up at this time.  Maybe she should be scheduled to see an APP to discuss this further.  Nelva Bush, MD [facility] Pager: [phone]

## 2019-08-24 NOTE — Telephone Encounter (Signed)
NO answer at PCP's office. Left detailed message on secure VM with Dr Darnelle Bos recommendations and to call back if any further questions.   Attempted to reach patient using [employer] Interpreter ID [phone] to schedule 6 month follow up. No answer and interpreter left message to have patient call back to schedule. Routing to scheduling. Please route back with update when able. Thanks!

## 2019-08-31 NOTE — Telephone Encounter (Signed)
Routing to scheduling to reach out to patient to schedule appointment. Will need to use interpreter line.  Thanks!

## 2019-09-02 NOTE — Telephone Encounter (Signed)
lmov to schedule  °

## 2019-09-30 NOTE — Telephone Encounter (Signed)
Attempted to schedule.  Will try next week with interpreter as patient currently out shopping .

## 2019-11-06 NOTE — Telephone Encounter (Signed)
Attempted to schedule with interpreter

## 2019-11-10 ENCOUNTER — Emergency Department: Payer: Self-pay

## 2019-11-10 ENCOUNTER — Other Ambulatory Visit: Payer: Self-pay

## 2019-11-10 DIAGNOSIS — R0602 Shortness of breath: Secondary | ICD-10-CM | POA: Insufficient documentation

## 2019-11-10 DIAGNOSIS — I252 Old myocardial infarction: Secondary | ICD-10-CM | POA: Insufficient documentation

## 2019-11-10 DIAGNOSIS — R0981 Nasal congestion: Secondary | ICD-10-CM | POA: Insufficient documentation

## 2019-11-10 DIAGNOSIS — Z5321 Procedure and treatment not carried out due to patient leaving prior to being seen by health care provider: Secondary | ICD-10-CM | POA: Insufficient documentation

## 2019-11-10 DIAGNOSIS — R079 Chest pain, unspecified: Secondary | ICD-10-CM | POA: Insufficient documentation

## 2019-11-10 LAB — URINALYSIS, COMPLETE (UACMP) WITH MICROSCOPIC
Bacteria, UA: NONE SEEN
Bilirubin Urine: NEGATIVE
Glucose, UA: NEGATIVE mg/dL
Hgb urine dipstick: NEGATIVE
Ketones, ur: NEGATIVE mg/dL
Leukocytes,Ua: NEGATIVE
Nitrite: NEGATIVE
Protein, ur: NEGATIVE mg/dL
Specific Gravity, Urine: 1.001 — ABNORMAL LOW (ref 1.005–1.030)
WBC, UA: NONE SEEN WBC/hpf (ref 0–5)
pH: 8 (ref 5.0–8.0)

## 2019-11-10 LAB — CBC
HCT: 41.6 % (ref 36.0–46.0)
Hemoglobin: 14.3 g/dL (ref 12.0–15.0)
MCH: 31.9 pg (ref 26.0–34.0)
MCHC: 34.4 g/dL (ref 30.0–36.0)
MCV: 92.9 fL (ref 80.0–100.0)
Platelets: 251 10*3/uL (ref 150–400)
RBC: 4.48 MIL/uL (ref 3.87–5.11)
RDW: 11.3 % — ABNORMAL LOW (ref 11.5–15.5)
WBC: 8.3 10*3/uL (ref 4.0–10.5)
nRBC: 0 % (ref 0.0–0.2)

## 2019-11-10 LAB — COMPREHENSIVE METABOLIC PANEL
ALT: 18 U/L (ref 0–44)
AST: 18 U/L (ref 15–41)
Albumin: 4.6 g/dL (ref 3.5–5.0)
Alkaline Phosphatase: 104 U/L (ref 38–126)
Anion gap: 10 (ref 5–15)
BUN: 12 mg/dL (ref 6–20)
CO2: 26 mmol/L (ref 22–32)
Calcium: 10.2 mg/dL (ref 8.9–10.3)
Chloride: 103 mmol/L (ref 98–111)
Creatinine, Ser: 0.76 mg/dL (ref 0.44–1.00)
GFR calc Af Amer: >60 mL/min (ref >60)
GFR calc non Af Amer: >60 mL/min (ref >60)
Glucose, Bld: 97 mg/dL (ref 70–99)
Potassium: 3.6 mmol/L (ref 3.5–5.1)
Sodium: 139 mmol/L (ref 135–145)
Total Bilirubin: 0.8 mg/dL (ref 0.3–1.2)
Total Protein: 8.2 g/dL — ABNORMAL HIGH (ref 6.5–8.1)

## 2019-11-10 LAB — TROPONIN I (HIGH SENSITIVITY): Troponin I (High Sensitivity): 16 ng/L (ref <18)

## 2019-11-10 NOTE — ED Triage Notes (Addendum)
Pt in with co chest pain that started 2 hrs with shob. Pt states it started after she found out her brother had passed. Pt states she has had some congestion recently, does have an appointment with the cardiologist tomorrow. Hx of MI a year ago. Pt took 1 nitro with small amt of relief, states has same reaction when mother died 2 years ago.

## 2019-11-11 ENCOUNTER — Other Ambulatory Visit: Payer: Self-pay

## 2019-11-11 ENCOUNTER — Encounter: Payer: Self-pay | Admitting: Internal Medicine

## 2019-11-11 ENCOUNTER — Emergency Department
Admission: EM | Admit: 2019-11-11 | Discharge: 2019-11-11 | Disposition: A | Discharge status: Home / Self Care | Payer: Self-pay | Attending: Emergency Medicine | Admitting: Emergency Medicine

## 2019-11-11 ENCOUNTER — Ambulatory Visit (INDEPENDENT_AMBULATORY_CARE_PROVIDER_SITE_OTHER): Payer: Self-pay | Admitting: Internal Medicine

## 2019-11-11 VITALS — BP 110/70 | HR 72 | Ht 65.0 in | Wt 170.0 lb

## 2019-11-11 DIAGNOSIS — I2542 Coronary artery dissection: Secondary | ICD-10-CM

## 2019-11-11 DIAGNOSIS — E785 Hyperlipidemia, unspecified: Secondary | ICD-10-CM

## 2019-11-11 DIAGNOSIS — R0789 Other chest pain: Secondary | ICD-10-CM

## 2019-11-11 MED ORDER — ISOSORBIDE MONONITRATE ER 30 MG PO TB24: 15.0000 mg | ORAL_TABLET | Freq: Every day | ORAL | 3 refills | Status: DC

## 2019-11-11 NOTE — Patient Instructions (Signed)
Medication Instructions:  Your physician has recommended you make the following change in your medication:  1- START Imdur 15 mg (0.5 tablet) by mouth daily.  *If you need a refill on your cardiac medications before your next appointment, please call your pharmacy*  Lab Work: none If you have labs (blood work) drawn today and your tests are completely normal, you will receive your results only by: Marland Kitchen MyChart Message (if you have MyChart) OR . A paper copy in the mail If you have any lab test that is abnormal or we need to change your treatment, we will call you to review the results.  Testing/Procedures: none  Follow-Up: At [facility], you and your health needs are our priority.  As part of our continuing mission to provide you with exceptional heart care, we have created designated Provider Care Teams.  These Care Teams include your primary Cardiologist (physician) and Advanced Practice Providers (APPs -  Physician Assistants and Nurse Practitioners) who all work together to provide you with the care you need, when you need it.  Your next appointment:   2 week(s)  With APP  The format for your next appointment:   In Person  Provider:    You may see one of the following Advanced Practice Providers on your designated Care Team:    Nicolasa Ducking, NP  Eula Listen, PA-C  Marisue Ivan, PA-C

## 2019-11-11 NOTE — Progress Notes (Signed)
Follow-up Outpatient Visit Date: 11/11/2019  Primary Care Provider: Verlin Dike, [place] [address]  Chief Complaint: Chest pain  HPI:  [name] is a 54 y.o. 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  We last spoke via virtual visit in early 02/2019, at which time she reported improvement in her breathing after discontinuation of beta-blocker.  She reported a single episode of chest discomfort after receiving bad news about a family member.  This resolved promptly with a single dose of sublingual nitroglycerin.  She otherwise has been feeling well.  She presented to the emergency department last night with chest pain and shortness of breath x2 hours after she found out that her brother had passed away from COVID-19.  Discomfort and dyspnea reportedly resolved after a single dose of sublingual nitroglycerin.  EKG showed sinus rhythm with no significant abnormality, though baseline wander limited evaluation.  Labs were negative for high-sensitivity troponin of 16x1.  Today, [name] reports feeling better, though she has continued to have intermittent mild central chest discomfort.  It has actually been happening at least once or twice a week ever since her NSTEMI due to SCAD in 2019.  She sometimes takes NTG with prompt relief, though she was frightened yesterday as the NTG did not help her symptoms yesterday.  Her chest pain after hearing of her brother's death was accompanied by headache, shortness of breath, and "shaking all over."   She denies palpitations, lightheadedness, and edema.  --------------------------------------------------------------------------------------------------  Past Medical History:  Diagnosis Date  . Diastolic dysfunction    a.  07/2018 Echo: EF 55-60%, no rwma, Gr1 DD, mildly dil LA. Nl RV size/fxn.  Marland Kitchen Dysmenorrhea   . Hyperlipidemia LDL goal <70   . Menorrhagia   . Spontaneous dissection of coronary artery    a. 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   Past Surgical History:  Procedure Laterality Date  . ABDOMINAL HYSTERECTOMY    . CHOLECYSTECTOMY    . HERNIA REPAIR    . LEFT HEART CATH AND CORONARY ANGIOGRAPHY N/A 08/06/2018   Procedure: LEFT HEART CATH AND CORONARY ANGIOGRAPHY;  Surgeon: Nelva Bush, MD;  Location: [facility];  Service: Cardiovascular;  Laterality: N/A;     Recent CV Pertinent Labs: Lab Results  Component Value Date   CHOL 185 08/06/2018   HDL 40 (L) 08/06/2018   LDLCALC 118 (H) 08/06/2018   TRIG 133 08/06/2018   CHOLHDL 4.6 08/06/2018   K 3.6 11/10/2019   K 3.5 04/14/2014   MG 2.1 08/06/2018   BUN 12 11/10/2019   BUN 10 04/14/2014   CREATININE 0.76 11/10/2019   CREATININE 0.75 04/14/2014    Past medical and surgical history were reviewed and updated in EPIC.  Current Meds  Medication Sig  . aspirin 81 MG chewable tablet Chew 1 tablet (81 mg total) by mouth daily.  Marland Kitchen atorvastatin (LIPITOR) 40 MG tablet Take 1 tablet (40 mg total) by mouth daily at 6 PM.    Allergies: Patient has no known allergies.  Social History   Tobacco Use  . Smoking status: Never Smoker  . Smokeless tobacco: Never Used  Substance Use Topics  . Alcohol use: No  .  Drug use: Not on file    Family History  Problem Relation Age of Onset  . CAD Mother   . Diabetes Mother   . Heart attack Mother   . CAD Father   . Diabetes Father   . Heart attack Father     Review of Systems: A 12-system review of systems was performed and was negative except as noted in the  HPI.  --------------------------------------------------------------------------------------------------  Physical Exam: BP 110/70 (BP Location: Left Arm, Patient Position: Sitting, Cuff Size: Normal)   Pulse 72   Ht 5\' 5"  (1.651 m)   Wt 170 lb (77.1 kg)   SpO2 96%   BMI 28.29 kg/m   General:  NAD.  Accompanied by Spanish interpreter. HEENT: No conjunctival pallor or scleral icterus. Facemask in place. Neck: Supple without lymphadenopathy, thyromegaly, JVD, or HJR. Lungs: Normal work of breathing. Clear to auscultation bilaterally without wheezes or crackles. Heart: Regular rate and rhythm without murmurs, rubs, or gallops. Abd: Bowel sounds present. Soft, NT/ND without hepatosplenomegaly Ext: No lower extremity edema.  EKG:  NSR without abnormality.  Lab Results  Component Value Date   WBC 8.3 11/10/2019   HGB 14.3 11/10/2019   HCT 41.6 11/10/2019   MCV 92.9 11/10/2019   PLT 251 11/10/2019    Lab Results  Component Value Date   NA 139 11/10/2019   K 3.6 11/10/2019   CL 103 11/10/2019   CO2 26 11/10/2019   BUN 12 11/10/2019   CREATININE 0.76 11/10/2019   GLUCOSE 97 11/10/2019   ALT 18 11/10/2019    Lab Results  Component Value Date   CHOL 185 08/06/2018   HDL 40 (L) 08/06/2018   LDLCALC 118 (H) 08/06/2018   TRIG 133 08/06/2018   CHOLHDL 4.6 08/06/2018    --------------------------------------------------------------------------------------------------  ASSESSMENT AND PLAN: Chest pain and history of SCAD: Intermittent atypical chest pain has been present at least once or twice a week ever since NSTEMI due to SCAD in 07/2018.  Yesterdays more severe episode was likely a stress reaction related to news of the her brother's death.  EKG and negative HS-TnI yesterday are reassuring (though troponin trend would have been more helpful).  I have recommended that [name] go to the ED for reevaluation, given continued intermittent chest pain.  However, she  refuses.  As her pain is typically responsive to SL NTG, we have agreed to add isosorbide mononitrate 15 mg daily.  We also discussed retrial of a beta blocker but have agreed to defer this, as she was intolerant of low-dose metoprolol in the past due to fatigue and shortness of breath.  I advised her to call 911 or return to the ED should her chest pain worsen.  Continue aspirin 81 mg daily.  Hyperlipidemia: Continue atorvastatin 40 mg daily.  We will need repeat lipid panel at follow-up if it has not be performed by her PCP within the last 12 months (we will request records).  Follow-up: Return to clinic in 2 weeks.  Bettina Gavia, MD 11/13/2019 8:09 AM

## 2019-11-17 ENCOUNTER — Telehealth: Payer: Self-pay | Admitting: *Deleted

## 2019-11-17 NOTE — Telephone Encounter (Signed)
Request for labs faxed to [phone] at [facility] with Dr Ina Homes.

## 2019-11-17 NOTE — Telephone Encounter (Signed)
-----   Message from Yvonne Kendall, MD sent at 11/13/2019  8:17 AM EST ----- Hi Ja Pistole,Do you mind reaching out to the patient's PCP to see if she has had labs (specifically LFT's and lipid panel) within the last year?  Thanks.Thayer Ohm

## 2019-11-25 ENCOUNTER — Encounter: Payer: Self-pay | Admitting: Family

## 2019-11-25 ENCOUNTER — Other Ambulatory Visit: Payer: Self-pay

## 2019-11-25 ENCOUNTER — Ambulatory Visit (INDEPENDENT_AMBULATORY_CARE_PROVIDER_SITE_OTHER): Payer: Self-pay | Admitting: Family

## 2019-11-25 VITALS — BP 120/70 | HR 75 | Ht 65.0 in | Wt 174.5 lb

## 2019-11-25 DIAGNOSIS — I2542 Coronary artery dissection: Secondary | ICD-10-CM

## 2019-11-25 DIAGNOSIS — R0789 Other chest pain: Secondary | ICD-10-CM

## 2019-11-25 DIAGNOSIS — E785 Hyperlipidemia, unspecified: Secondary | ICD-10-CM

## 2019-11-25 DIAGNOSIS — I214 Non-ST elevation (NSTEMI) myocardial infarction: Secondary | ICD-10-CM

## 2019-11-25 DIAGNOSIS — E781 Pure hyperglyceridemia: Secondary | ICD-10-CM

## 2019-11-25 NOTE — Patient Instructions (Addendum)
Medication Instructions: (Medicinas)  Continua la misma mediccaciones. No cambias hoy.  *If you need a refill on your cardiac medications before your next appointment, please call your pharmacy* *Si necesitas un "refill", por favor llama su farmacia*  Lab Work: Press photographer. Laboratorios en el hospital y con Dr. Conni Slipper. Los "triglycerides" un tipo de colesterol un poca alta. Come menos azucar y carbohidratos.  Testing/Procedures: EKG hoy es buena.   Follow-Up: At [facility], you and your health needs are our priority.  As part of our continuing mission to provide you with exceptional heart care, we have created designated Provider Care Teams.  These Care Teams include your primary Cardiologist (physician) and Advanced Practice Providers (APPs -  Physician Assistants and Nurse Practitioners) who all work together to provide you with the care you need, when you need it.  Your next appointment:  ([place]) En tres meses con Dr. Okey Dupre or asistente  Other Instructions  Plan de alimentacin restringido en grasas y colesterol Fat and Cholesterol Restricted Eating Plan Seguir una dieta restringida en grasas y colesterol puede ayudar a reducir el riesgo de tener enfermedades cardacas y otras afecciones. El cuerpo necesita grasas y colesterol para las funciones bsicas; no obstante, si estas sustancias se consumen en exceso, pueden ser perjudiciales para la salud. El mdico puede solicitarle anlisis de laboratorio para comprobar sus niveles de grasas (lpidos) y colesterol en la sangre. Ese anlisis le ayuda al mdico a comprender cul es su riesgo de tener ciertas afecciones y si es necesario que haga cambios en su alimentacin. Consulte con su mdico o nutricionista para crear un plan de alimentacin que sea adecuado para usted. Cules son algunos consejos para seguir este plan? Pautas generales   Si tiene sobrepeso, consulte con su mdico para adelgazar sin riegos.  Perder solo del 5 al 10% de su peso puede mejorar su estado de salud general y Contractor a prevenir enfermedades como la diabetes y las enfermedades cardacas.  Evite lo siguiente: ? Alimentos con Engineer, mining. ? Comidas fritas. ? Alimentos que contienen aceites parcialmente hidrogenados, como margarina en barra, algunas margarinas untables, galletitas dulces, galletas saladas y otros productos horneados.  Limite el consumo de alcohol a no ms de por da si es mujer y no est Botines, y a por da si es hombre. Una medida equivale a 12oz ( ) de cerveza, 5oz ( ) de vino o 1oz (11ml) de bebidas alcohlicas de alta graduacin. Leer las etiquetas de los alimentos  Lea las etiquetas de los alimentos para conocer lo siguiente: ? Si contienen grasas trans, aceites parcialmente hidrogenados o altas cantidades de grasas saturadas. Evite los alimentos que contengan grasas saturadas y grasas trans. ? La cantidad de colesterol que contiene cada porcin. Intente comer no ms de 200mg  de colesterol por da. ? La cantidad de fibra que contiene cada porcin. Intente comer al 20 y 30g de [employer].  Elija alimentos con grasas saludables, tales como las siguientes: ? Grasas monoinsaturadas y poliinsaturadas. Estas incluyen aceite de oliva y de canola, semillas de lino, nueces, almendras y semillas. ? Grasas omega-3. Estas se encuentran en alimentos tales como el salmn, la caballa, las sardinas, el atn, el aceite de lino y las semillas de lino molidas.  Elija productos de cereal que tengan cereales integrales. Busque la palabra "integral" en Quarry manager de la lista de ingredientes. Al cocinar  Evite frer los alimentos a la hora de la coccin. Algunas opciones de coccin  saludables son hornear, hervir, Software engineer y asar a la parrilla.  Consuma ms comida casera y menos de restaurante, de bares y comida rpida.  Evite cocinar usando grasas  saturadas. ? Las grasas saturadas de origen animal incluyen carnes, [place] y crema. ? Las grasas saturadas de origen vegetal incluyen aceite de palma, de palmiste y de coco. Planificacin de las comidas   En las comidas, imagine dividir su plato en cuartos: ? Llene la mitad del plato con verduras y ensaladas de hojas verdes. ? Llene un cuarto del plato con cereales integrales. ? Llene un cuarto del plato con alimentos con protenas magras.  Coma pescado con alto contenido de grasas omega-3 al [employer] veces por semana.  Coma ms alimentos que contengan fibra, como cereales [place], frijoles, [place], [place], [place], guisantes y [place]. Estos alimentos favorecen niveles de colesterol saludables en la sangre. Alimentos recomendados [employer], como panes, galletas, cereales y pastas de integrales o de trigo integral. Avena sin endulzar, trigo bulgur, cebada, quinua o arroz integral. Tortillas de harina de maz o trigo integral. Hoover Brunette  Verduras frescas o congeladas (crudas, al vapor, asadas o grilladas). Ensaladas de hojas verdes. Nils Pyle  Nils Pyle frescas, en conserva (en su jugo natural) o frutas congeladas. Carnes y otros alimentos ricos en protenas  Carne de res molida (al 85% o ms magra), carne de res de animales alimentados con pastos o carne de res sin la grasa. Pollo o pavo sin piel. Carne de pollo o de [place]. Cerdo sin la grasa. Todos los pescados y frutos de mar. Claras de huevo. Porotos, guisantes o lentejas secos. Frutos secos o semillas sin sal. Frijoles enlatados sin sal. Mantequillas de frutos secos naturales sin azcar ni aceites agregados. Lcteos  Productos lcteos descremados o semidescremados, como [employer] o al 1%, quesos reducidos en grasas o al 2%, queso cottage o ricota con bajo contenido de [place] o sin contenido de [place], o yogur natural descremado o semidescremado. Grasas y aceites  Margarina untable que no  contenga grasas trans. [place]a y condimentos para ensaladas livianos o reducidos en grasas. [place]. Aceites de oliva, canola, ssamo o crtamo. Es posible que los productos que se enumeran ms arriba no sean una lista completa de las bebidas o los alimentos recomendados. Comunquese con su nutricionista para conocer ms opciones. Alimentos que se deben evitar Cereales  Pan blanco. Pastas blancas. Arroz blanco. Pan de maz. Bagels, pasteles y croissants. Galletas saladas y colaciones que contengan grasas trans y aceites hidrogenados. Verduras  Verduras cocinadas con salsas de queso, crema o mantequilla. Verduras fritas. Nils Pyle  Fruta enlatada en almbar espeso. Frutas con salsa de crema o [place]. Frutas cocidas en aceite. Carnes y otros alimentos ricos en protenas  Cortes de carne con grasa. Costillas, alas de pollo, tocineta, salchicha, mortadela, salame, chinchulines, tocino, perros calientes, salchichas alemanas y embutidos envasados. Hgado y otros rganos. Huevos enteros y yemas de [place]. Pollo y pavo con piel. Carne frita. Lcteos  Leche entera o al 2%, crema, mezcla de [place] y crema, y queso crema. Quesos enteros. Yogur entero o endulzado. Quesos con toda su grasa. [place] no lcteas y coberturas batidas. Quesos procesados, quesos para untar y [place]. Bebidas  Alcohol. Bebidas endulzadas con azcar, como refrescos, limonada y bebidas frutales. Grasas y [address], [place] en barra, [place], [place], [place] clarificada o grasa de tocino. Aceites de coco, de palmiste y de palma. Dulces y postres  Jarabe de maz, azcares, miel y Radio broadcast assistant. Caramelos. Mermelada y [place].  Chrissie Noa. Cereales endulzados. Galletas, pasteles, bizcochuelos, donas, muffins y helado. Es posible que los productos que se enumeran ms arriba no sean una lista completa de los alimentos y las bebidas que se Higher education careers adviser. Comunquese con su nutricionista para obtener ms  informacin. Resumen  El cuerpo necesita grasas y colesterol para las funciones bsicas. No obstante, si estas sustancias se consumen en exceso, pueden ser perjudiciales para la salud.  Consulte con su mdico y su nutricionista para seguir una dieta con bajo contenido de grasas y colesterol. [place]acerlo puede ayudar a disminuir su riesgo de tener enfermedades cardacas y otras afecciones.  Elija grasas saludables, como las grasas monoinsaturadas y Pharmacist, hospital, y alimentos con alto contenido de cidos grasos omega-3.  Consuma alimentos ricos en fibras, como cereales integrales, frijoles, guisantes, frutas y verduras.  Limite o evite el consumo de alcohol, las comidas fritas y los alimentos con alto contenido de grasas saturadas, aceites parcialmente hidrogenados y Location manager. Esta informacin no tiene Marine scientist el consejo del mdico. Asegrese de hacerle al mdico cualquier pregunta que tenga. Document Revised: 08/12/2017 Document Reviewed: 08/12/2017 Elsevier Patient Education  2020 [employer].

## 2019-11-25 NOTE — Progress Notes (Signed)
Office Visit    Patient Name: Elaine Fernandez Date of Encounter: 11/25/2019  Primary Care Provider:  Verlin Dike, MD Primary Cardiologist:  Nelva Bush, MD Electrophysiologist:  None   Chief Complaint    Elaine Fernandez is a 54 y.o. female with a hx of CAD with NSTEMI secondary to spontaneous coronary artery dissection 07/2018, HLD, dysmenorrhea with menorrhagia s/p hysterectomy presents today for follow up after Imdur initiation.   Past Medical History    Past Medical History:  Diagnosis Date  . Diastolic dysfunction    a. 07/2018 Echo: EF 55-60%, no rwma, Gr1 DD, mildly dil LA. Nl RV size/fxn.  Marland Kitchen Dysmenorrhea   . Hyperlipidemia LDL goal <70   . Menorrhagia   . Spontaneous dissection of coronary artery    a. 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   Past Surgical History:  Procedure Laterality Date  . ABDOMINAL HYSTERECTOMY    . CHOLECYSTECTOMY    . HERNIA REPAIR    . LEFT HEART CATH AND CORONARY ANGIOGRAPHY N/A 08/06/2018   Procedure: LEFT HEART CATH AND CORONARY ANGIOGRAPHY;  Surgeon: Nelva Bush, MD;  Location: [facility];  Service: Cardiovascular;  Laterality: N/A;    Allergies  No Known Allergies  History of Present Illness    Elaine Fernandez is a 54 y.o. female with a hx of hx of CAD with NSTEMI secondary to spontaneous coronary artery dissection 07/2018, HLD, dysmenorrhea with menorrhagia s/p hysterectomy. She was last seen 11/21/19.  She was started on Imdur 11/11/19 when seen in the office for chest pain. ED visit the previous day with unrevealing workup.   Present today with interpretor.   Reports some abdominal pain, headache, and fatigue after initiating, but tells me these symptoms are improving. Only has had one very short and less severe episode of chest discomfort that occurred during a time  of stress.   No DOE, orthopnea, PND, edema.  Reports when she lays on her left side she notices some shortness of breath and palpitations. We discussed that this is likely from her bodyweight on her heart and a common finding. Likely PVCs. Recommended she avoid laying on left side. She was appreciative of the explanation.   Labs via PCP 05/2019:  Total cholesterol 170,triglycerides 358, HDL 49, LDL 49  Creatinine 0.69, GFR 101, K 4.4  A1c 5.4  Labs via PCP 08/2019:  TSH 1.14, T4 6.4  A1c 5.6  NT Pro BNp 79  EKGs/Labs/Other Studies Reviewed:   The following studies were reviewed today:  EKG:  EKG is ordered today.  The ekg ordered today demonstrates SR 75 bpm with no ST/T wave changes  Recent Labs: 11/10/2019: ALT 18; BUN 12; Creatinine, Ser 0.76; Hemoglobin 14.3; Platelets 251; Potassium 3.6; Sodium 139  Recent Lipid Panel    Component Value Date/Time   CHOL 185 08/06/2018 0634   TRIG 133 08/06/2018 0634   HDL 40 (L) 08/06/2018 0634   CHOLHDL 4.6 08/06/2018 0634   VLDL 27 08/06/2018 0634   LDLCALC 118 (H) 08/06/2018 0634    Home Medications   Current Meds  Medication Sig  . aspirin 81 MG chewable tablet Chew 1 tablet (81 mg total) by mouth daily.  Marland Kitchen atorvastatin (LIPITOR) 40 MG tablet Take 1 tablet (40 mg total) by mouth daily at 6 PM.  . isosorbide mononitrate (IMDUR) 30 MG 24  hr tablet Take 0.5 tablets (15 mg total) by mouth daily.    Review of Systems      Review of Systems  Constitution: Negative for chills, fever and malaise/fatigue.  Cardiovascular: Negative for dyspnea on exertion, leg swelling, near-syncope, orthopnea, palpitations and syncope. Chest pain: One episode, very short.  Respiratory: Negative for cough, shortness of breath and wheezing.   Gastrointestinal: Negative for nausea and vomiting.  Neurological: Negative for dizziness, light-headedness and weakness.   All other systems reviewed and are otherwise negative except as noted  above.  Physical Exam    VS:  BP 120/70 (BP Location: Left Arm, Patient Position: Sitting, Cuff Size: Normal)   Pulse 75   Ht 5\' 5"  (1.651 m)   Wt 174 lb 8 oz (79.2 kg)   SpO2 98%   BMI 29.04 kg/m  , BMI Body mass index is 29.04 kg/m. GEN: Well nourished, well developed, in no acute distress. HEENT: normal. Neck: Supple, no JVD, carotid bruits, or masses. Cardiac: RRR, no murmurs, rubs, or gallops. No clubbing, cyanosis, edema.  Radials/DP/PT 2+ and equal bilaterally.  Respiratory:  Respirations regular and unlabored, clear to auscultation bilaterally. GI: Soft, nontender, nondistended, BS + x 4. MS: No deformity or atrophy. Skin: Warm and dry, no rash. Neuro:  Strength and sensation are intact. Psych: Normal affect.  Accessory Clinical Findings    ECG personally reviewed by me today - SR 75 bpm - no acute changes.  Assessment & Plan    1. CAD with hx of SCAD - ED visit 11/11/19 with chest pain with negative HS-TNl and EKG. F/u with Dr. 01/09/20 11/11/19 with initiation of Imdur - chest pain thought to be stress reaction to news of brothers death. Symptoms much improved with only 1 short episode of chest pain which was not as severe. Discussed increasing Imdur dose to 30mg  and she politely declines. No indication for ischemic evaluation at this time.    Hx of intolerance to low-dose metoprolol with fatigue and shortness of breath.   Continue Aspirin 81mg , Imdur 15mg , Atorvastatin 40mg .  2. HLD/Hypertriglyceridemia - Continue Atorvastatin 40mg  daily. 05/2019 labs show LDL 49, at goal of less than 70. She had elevated triglycerides and dietary changes such as reducing sugars, carbohydrates were recommended. Plan to recheck 05/2019 and if triglycerides remain elevated consider Vascepa or Lovaza.  Disposition: Follow up in 3 month(s) with Dr. or AP.    , NP 11/25/2019, 2:12 PM

## 2019-12-28 ENCOUNTER — Ambulatory Visit: Payer: Self-pay | Attending: Internal Medicine

## 2019-12-28 DIAGNOSIS — Z23 Encounter for immunization: Secondary | ICD-10-CM

## 2019-12-28 NOTE — Progress Notes (Signed)
   Covid-19 Vaccination Clinic  Name:  Elaine Fernandez    MRN: [id number] DOB: 01-Nov-1965  12/28/2019  Elaine Fernandez was observed post Covid-19 immunization for 30 minutes based on pre-vaccination screening .  During the observation period, she experienced an adverse reaction with the following symptoms:  chest pain has history of angina and anxiety  Assessment : Time of assessment Alert and oriented.  Actions taken:  VS with BP 155/87. P- 65, O2-100%    Medications administered: No medication administered.  Disposition: Reports no further symptoms of adverse reaction after observation for 30 minutes. Discharged home.   Immunizations Administered    Name Date Dose VIS Date Route   Pfizer COVID-19 Vaccine 12/28/2019  4:35 PM 0.3 mL 09/18/2019 Intramuscular   Manufacturer: [employer], [employer]   Lot: [id number]   NDC: [address]-4

## 2019-12-28 NOTE — Progress Notes (Signed)
   Covid-19 Vaccination Clinic  Name:  Elaine Fernandez    MRN: [id number] DOB: 09-04-1966  12/28/2019  [name] was observed post Covid-19 immunization for 30 minutes based on pre-vaccination screening without incident. She was provided with Vaccine Information Sheet and instruction to access the V-Safe system.   [name] was instructed to call 911 with any severe reactions post vaccine: Marland Kitchen Difficulty breathing  . Swelling of face and throat  . A fast heartbeat  . A bad rash all over body  . Dizziness and weakness   Immunizations Administered    Name Date Dose VIS Date Route   Pfizer COVID-19 Vaccine 12/28/2019  4:35 PM 0.3 mL 09/18/2019 Intramuscular   Manufacturer: [employer], [employer]   Lot: [id number]   NDC: [address]-5

## 2020-01-01 ENCOUNTER — Telehealth: Payer: Self-pay | Admitting: *Deleted

## 2020-01-01 NOTE — Telephone Encounter (Signed)
Please call patient back.  This does not sound like an allergic reaction. Having fevers, body aches and chills can happen after COVID vaccine means she has strong immune response.   She is okay to get the second vaccine with 30 minute wait period.  Regarding the hand swelling - if it's not better, she should go see PCP.

## 2020-01-01 NOTE — Telephone Encounter (Signed)
Called and spoke with Elaine Fernandez regarding her COVID vaccine that she received on March 22,2021. Patient preferred Spanish speaking. Called back with Spanish Interpreter Elaine Fernandez ID: [phone]. Spoke with patient and she stated that she has had flu like symptoms since receiving her COVID vaccine. She stated that she has been having fever, body aches, and chills. She states that she does not have a thermometer at home but she does believe that her temperature was relatively high. She also complains of hand swelling since receiving her COVID vaccine. She states that she does still have some residual swelling in her hands. Asked the patient that she has any worsening symptoms to please seek immediate care at the nearest Urgent Care or Emergency Department. Advised to patient that we will call her back to see how she is doing. Patient verbalized understanding. Patient does require a Research officer, trade union for contact.

## 2020-01-04 NOTE — Telephone Encounter (Signed)
Called through interpreter services and spoke with Dois Davenport ID#: [id number], she attempted to call Chantil for me and was not successful, there was no dial tone and no answer or voicemail, will attempt to call the patient back.

## 2020-01-05 NOTE — Telephone Encounter (Signed)
Called using a Spanish Interpreter Areli ID: [phone] and left a voicemail asking for the patient to call back to inform of COVID vaccine.

## 2020-01-12 NOTE — Telephone Encounter (Signed)
Attempted a second call with Spanish Interpreter Gunnar Fusi [phone] and was unable to reach the patient. A letter has been mailed to the patient's home.

## 2020-01-18 ENCOUNTER — Ambulatory Visit: Payer: Self-pay | Attending: Internal Medicine

## 2020-01-18 ENCOUNTER — Other Ambulatory Visit: Payer: Self-pay

## 2020-01-18 DIAGNOSIS — Z23 Encounter for immunization: Secondary | ICD-10-CM

## 2020-01-18 NOTE — Progress Notes (Signed)
   Covid-19 Vaccination Clinic  Name:  Elaine Fernandez    MRN: [id number] DOB: 10/04/1966  01/18/2020  [name] was observed post Covid-19 immunization for 30 minutes based on pre-vaccination screening without incident. She was provided with Vaccine Information Sheet and instruction to access the V-Safe system. Medical interpreter used.  [name] was instructed to call 911 with any severe reactions post vaccine: Marland Kitchen Difficulty breathing  . Swelling of face and throat  . A fast heartbeat  . A bad rash all over body  . Dizziness and weakness   Immunizations Administered    Name Date Dose VIS Date Route   Pfizer COVID-19 Vaccine 01/18/2020  6:33 PM 0.3 mL 09/18/2019 Intramuscular   Manufacturer: [employer], [employer]   Lot: [id number]   NDC: [address]-9

## 2020-02-24 ENCOUNTER — Ambulatory Visit: Payer: Self-pay | Admitting: Internal Medicine

## 2020-02-24 NOTE — Progress Notes (Deleted)
Follow-up Outpatient Visit Date: 02/24/2020  Primary Care Provider: Verlin Dike, MD [place] [address]  Chief Complaint: ***  HPI:  [name] is a 54 y.o. 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  She was last seen in our office by Laurann Montana, NP, in late February after an episode of recurrent atypical chest pain earlier that month.  Chest pain as well as other nonspecific symptoms including abdominal pain, headache, and fatigue were all improving.  No medication changes were made.  --------------------------------------------------------------------------------------------------  Past Medical History:  Diagnosis Date  . Diastolic dysfunction    a. 07/2018 Echo: EF 55-60%, no rwma, Gr1 DD, mildly dil LA. Nl RV size/fxn.  Marland Kitchen Dysmenorrhea   . Hyperlipidemia LDL goal <70   . Menorrhagia   . Spontaneous dissection of coronary artery    a. 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   Past Surgical History:  Procedure Laterality Date  . ABDOMINAL HYSTERECTOMY    . CHOLECYSTECTOMY    . HERNIA REPAIR    . LEFT HEART CATH AND CORONARY ANGIOGRAPHY N/A 08/06/2018   Procedure: LEFT HEART CATH AND CORONARY ANGIOGRAPHY;  Surgeon: Nelva Bush, MD;  Location: [facility];  Service: Cardiovascular;  Laterality: N/A;    No outpatient medications have been marked as taking for the 02/24/20 encounter (Appointment) with Matt Delpizzo, Harrell Gave, MD.    Allergies: Patient has no known allergies.  Social History   Tobacco Use  . Smoking status: Never Smoker  . Smokeless tobacco: Never Used  Substance Use Topics  . Alcohol use: No  . Drug use: Not on file     Family History  Problem Relation Age of Onset  . CAD Mother   . Diabetes Mother   . Heart attack Mother   . CAD Father   . Diabetes Father   . Heart attack Father     Review of Systems: A 12-system review of systems was performed and was negative except as noted in the HPI.  --------------------------------------------------------------------------------------------------  Physical Exam: There were no vitals taken for this visit.  General:  *** HEENT: No conjunctival pallor or scleral icterus. Facemask in place. Neck: Supple without lymphadenopathy, thyromegaly, JVD, or HJR. Lungs: Normal work of breathing. Clear to auscultation bilaterally without wheezes or crackles. Heart: Regular rate and rhythm without murmurs, rubs, or gallops. Non-displaced PMI. Abd: Bowel sounds present. Soft, NT/ND without hepatosplenomegaly Ext: No lower extremity edema. Radial, PT, and DP pulses are 2+ bilaterally. Skin: Warm and dry without rash.  EKG:  ***  Lab Results  Component Value Date   WBC 8.3 11/10/2019   HGB 14.3 11/10/2019   HCT 41.6 11/10/2019   MCV 92.9 11/10/2019   PLT 251 11/10/2019    Lab Results  Component Value Date   NA 139 11/10/2019   K 3.6 11/10/2019   CL 103 11/10/2019   CO2 26 11/10/2019   BUN 12 11/10/2019   CREATININE 0.76 11/10/2019   GLUCOSE 97 11/10/2019   ALT 18 11/10/2019    Lab Results  Component Value Date   CHOL 185 08/06/2018   HDL 40 (L) 08/06/2018   LDLCALC 118 (H) 08/06/2018   TRIG 133 08/06/2018   CHOLHDL 4.6 08/06/2018    --------------------------------------------------------------------------------------------------  ASSESSMENT AND PLAN: Yvonne Kendall, MD 02/24/2020 7:59 AM

## 2020-02-25 ENCOUNTER — Encounter: Payer: Self-pay | Admitting: Internal Medicine

## 2020-05-23 ENCOUNTER — Other Ambulatory Visit: Payer: Self-pay

## 2020-05-23 ENCOUNTER — Ambulatory Visit (INDEPENDENT_AMBULATORY_CARE_PROVIDER_SITE_OTHER): Payer: Self-pay | Admitting: Family

## 2020-05-23 ENCOUNTER — Encounter: Payer: Self-pay | Admitting: Family

## 2020-05-23 VITALS — BP 116/76 | HR 69 | Ht 65.0 in | Wt 168.0 lb

## 2020-05-23 DIAGNOSIS — E785 Hyperlipidemia, unspecified: Secondary | ICD-10-CM

## 2020-05-23 DIAGNOSIS — R5383 Other fatigue: Secondary | ICD-10-CM

## 2020-05-23 DIAGNOSIS — I2542 Coronary artery dissection: Secondary | ICD-10-CM

## 2020-05-23 DIAGNOSIS — R079 Chest pain, unspecified: Secondary | ICD-10-CM

## 2020-05-23 DIAGNOSIS — R0609 Other forms of dyspnea: Secondary | ICD-10-CM

## 2020-05-23 DIAGNOSIS — R0600 Dyspnea, unspecified: Secondary | ICD-10-CM

## 2020-05-23 NOTE — Patient Instructions (Addendum)
Medication Instructions:   No cambios en las medicinas hoy. (No medicine changes today)  *If you need a refill on your cardiac medications before your next appointment, please call your pharmacy*  Lab Work: No laboratorios hoy. (No labs today)  Por favor, pregunta su doctor a enviar los laboratorios a Gillian Shields, NP. (Please ask your primary care doctor to send your lab work to our office)  Testing/Procedures: Su EKG hoy es normal. (Your EKG was normal)  Follow-Up: At [facility], you and your health needs are our priority.  As part of our continuing mission to provide you with exceptional heart care, we have created designated Provider Care Teams.  These Care Teams include your primary Cardiologist (physician) and Advanced Practice Providers (APPs -  Physician Assistants and Nurse Practitioners) who all work together to provide you with the care you need, when you need it.  We recommend signing up for the patient portal called "MyChart".  Sign up information is provided on this After Visit Summary.  MyChart is used to connect with patients for Virtual Visits (Telemedicine).  Patients are able to view lab/test results, encounter notes, upcoming appointments, etc.  Non-urgent messages can be sent to your provider as well.   To learn more about what you can do with MyChart, go to [email or web address].    Your next appointment:  (Tu proxima cita) En tres meses con Dr. Okey Dupre (in 3 months with Dr. Okey Dupre)  Other Instructions (Otros instrucciones)  Levanta sus piernas. (elevate your legs)  Continua la dieta con menos sal. (eat a low salt diet)

## 2020-05-23 NOTE — Progress Notes (Signed)
Office Visit    Patient Name: Elaine Fernandez Date of Encounter: 05/23/2020  Primary Care Provider:  Resa Miner, MD Primary Cardiologist:  Yvonne Kendall, MD Electrophysiologist:  None   Chief Complaint    Elaine Fernandez is a 54 y.o. female with a hx of CAD with NSTEMI secondary to spontaneous coronary artery dissection 07/2018, HLD, dysmenorrhea with menorrhagia s/p hysterectomy presents today for follow up of CAD.  Past Medical History    Past Medical History:  Diagnosis Date  . Diastolic dysfunction    a. 07/2018 Echo: EF 55-60%, no rwma, Gr1 DD, mildly dil LA. Nl RV size/fxn.  Marland Kitchen Dysmenorrhea   . Hyperlipidemia LDL goal <70   . Menorrhagia   . Spontaneous dissection of coronary artery    a. 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   Past Surgical History:  Procedure Laterality Date  . ABDOMINAL HYSTERECTOMY    . CHOLECYSTECTOMY    . HERNIA REPAIR    . LEFT HEART CATH AND CORONARY ANGIOGRAPHY N/A 08/06/2018   Procedure: LEFT HEART CATH AND CORONARY ANGIOGRAPHY;  Surgeon: Yvonne Kendall, MD;  Location: [facility];  Service: Cardiovascular;  Laterality: N/A;    Allergies  No Known Allergies  History of Present Illness    Elaine Fernandez is a 54 y.o. female with a hx of hx of CAD with NSTEMI secondary to spontaneous coronary artery dissection 07/2018, HLD, dysmenorrhea with menorrhagia s/p hysterectomy. She was last seen 11/21/19.  She was started on Imdur 11/11/19 when seen in the office for chest pain. ED visit the previous day with unrevealing workup.   She was last seen 11/2019. At that time she was tolerating Imdur well with no significant chest pain since initiation.   Labs via PCP 05/2019:  Total cholesterol 170,triglycerides 358, HDL 49, LDL 49  Creatinine 0.69, GFR 101, K 4.4  A1c 5.4  Labs via PCP  08/2019:  TSH 1.14, T4 6.4  A1c 5.6  NT Pro BNp 79  She has been following with her primary care provider for dyspnea on exertion and fatigue.  Tells me she had blood work done last week and plans for follow-up tomorrow.  Unclear what blood work was drawn but anticipate TSH, vitamins, CBC.  She tells me she reports some discomfort in her chest when she has difficulty with her breathing.  Her difficulty breathing is relieved when she goes to lay down.  She also has been having intermittent troubles with headache which is following with her primary care provider regarding.  She was having some lightheadedness to her PCP discontinued her Imdur.  Since that time she has had more dyspnea associated with some chest tightness.  She has remained lightheaded.  We discussed the importance of remaining adequately hydrated making position changes slowly.  She has trace lower extremity edema on exam.  She notices this is worse when she eats lots of salt and has been trying to avoid salt in her diet.  Also shares with me that she has a dentist appointment coming up and was told that she needed cardiac clearance.    Of note, visit completed with interpreter via telephone. EKGs/Labs/Other Studies Reviewed:   The following studies were reviewed today:  EKG:  EKG is ordered today.  The ekg ordered today demonstrates SR 69 bpm with no ST/T wave changes  Recent Labs:  11/10/2019: ALT 18; BUN 12; Creatinine, Ser 0.76; Hemoglobin 14.3; Platelets 251; Potassium 3.6; Sodium 139  Recent Lipid Panel    Component Value Date/Time   CHOL 185 08/06/2018 0634   TRIG 133 08/06/2018 0634   HDL 40 (L) 08/06/2018 0634   CHOLHDL 4.6 08/06/2018 0634   VLDL 27 08/06/2018 0634   LDLCALC 118 (H) 08/06/2018 0634    Home Medications   Current Meds  Medication Sig  . aspirin 81 MG chewable tablet Chew 1 tablet (81 mg total) by mouth daily.  Marland Kitchen atorvastatin (LIPITOR) 40 MG tablet Take 1 tablet (40 mg total) by mouth  daily at 6 PM.  . nitroGLYCERIN (NITROSTAT) 0.4 MG SL tablet Place 1 tablet (0.4 mg total) under the tongue every 5 (five) minutes as needed for chest pain.    Review of Systems   Review of Systems  Constitutional: Positive for malaise/fatigue. Negative for chills and fever.  Cardiovascular: Positive for chest pain (One episode, very short) and dyspnea on exertion. Negative for irregular heartbeat, leg swelling, near-syncope, orthopnea, palpitations and syncope.  Respiratory: Negative for cough and wheezing.   Gastrointestinal: Negative for melena, nausea and vomiting.  Genitourinary: Negative for hematuria.  Neurological: Negative for dizziness, light-headedness and weakness.   All other systems reviewed and are otherwise negative except as noted above.  Physical Exam    VS:  BP 116/76 (BP Location: Left Arm, Patient Position: Sitting, Cuff Size: Normal)   Pulse 69   Wt 168 lb (76.2 kg)   SpO2 97%   BMI 27.96 kg/m  , BMI Body mass index is 27.96 kg/m. GEN: Well nourished, well developed, in no acute distress. HEENT: normal. Neck: Supple, no JVD, carotid bruits, or masses. Cardiac: RRR, no murmurs, rubs, or gallops. No clubbing, cyanosis, edema.  Radials/DP/PT 2+ and equal bilaterally.  Respiratory:  Respirations regular and unlabored, clear to auscultation bilaterally. GI: Soft, nontender, nondistended, BS + x 4. MS: No deformity or atrophy. Skin: Warm and dry, no rash. Neuro:  Strength and sensation are intact. Psych: Normal affect.   Assessment & Plan    1. Preoperative cardiovascular clearance - Tells me she needs clearance for dental procedure.  Unclear what is she is having done but tells me that a filling fell out.  Simple dental procedures are routinely considered low risk and do not require cardiac clearance.  Would recommend she can continue her aspirin throughout the perioperative period however as she is greater than 1 year out from her cardiac event she may hold at  the discretion of with the dentist performing procedure.  2. Fatigue/Dyspnea- Presently undergoing work-up with PCP.  She prefers to await lab work results prior to undergoing any additional testing.  She has an appointment tomorrow for her lab work results.  Future considerations could include repeat echocardiogram versus Lexiscan.  As her EKG today is without acute ST/T wave changes I think it is reasonable to await lab work results first.  3. CAD with hx of SCAD -s/p NSTEMI due to CAD 07/2018.  ED visit 11/11/19 with chest pain with negative HS-TNl and EKG. F/u with Dr. Okey Dupre 11/11/19 with initiation of Imdur - chest pain thought to be stress reaction to news of brothers death.  Chest pain improved with addition of Imdur however this has subsequently been discontinued by her primary care provider due to lightheadedness.  However, lightheadedness persisted.     Hx of intolerance to low-dose metoprolol with fatigue and shortness of breath.   Continue Aspirin 81mg ,  Atorvastatin 40mg .  Discussed reinitiation of Imdur 15 mg daily and she prefers to follow-up with her primary care provider first. Of note, her intermittent chest pain symptoms were better controlled with Imdur.  EKG today with no acute ST/T wave changes.  No indication for ischemic evaluation at this time.  4. HLD/Hypertriglyceridemia - Continue Atorvastatin 40mg  daily. 05/2019 labs show LDL 49, at goal of less than 70. She had elevated triglycerides and dietary changes such as reducing sugars, carbohydrates were recommended.  Will need repeat lipid panel.  Will request from PCP as she shares with me that she had blood work last week.  Disposition: Follow up in 3 month(s) with Dr. or AP.    06/2019, NP 05/23/2020, 4:50 PM

## 2020-05-24 ENCOUNTER — Telehealth: Payer: Self-pay | Admitting: *Deleted

## 2020-05-24 NOTE — Telephone Encounter (Signed)
-----   Message from Alver Sorrow, NP sent at 05/23/2020  8:13 PM EDT ----- Regarding: dental clearance Attempted to call phone number she gave me for dentist, but it was incorrect number.  Appears from Care Everywhere she talked with [facility] (with [facility]) phone #[phone]. But she told me dentist was in [place] so unclear. I did try to call that number and there was no answer and no option to leave VM.  I think she can proceed with dental procedure and I have put that in my note.   Can we possibly reach out to her to find out who to fax the note to? (Will need Spanish interpretor).   Thank you!! Alver Sorrow, NP

## 2020-05-24 NOTE — Telephone Encounter (Signed)
Using [employer] ID [phone].  No answer. Left message to call back.

## 2020-05-30 NOTE — Telephone Encounter (Signed)
Using [employer] ID [phone].  No answer. Left message to call back.

## 2020-06-01 ENCOUNTER — Ambulatory Visit
Admission: RE | Admit: 2020-06-01 | Discharge: 2020-06-01 | Disposition: A | Discharge status: Home / Self Care | Payer: Self-pay | Source: Ambulatory Visit | Attending: Oncology | Admitting: Oncology

## 2020-06-01 ENCOUNTER — Ambulatory Visit: Payer: Self-pay | Attending: Oncology

## 2020-06-01 ENCOUNTER — Other Ambulatory Visit: Payer: Self-pay

## 2020-06-01 VITALS — BP 113/72 | HR 63 | Temp 98.6°F | Ht 65.0 in | Wt 168.7 lb

## 2020-06-01 DIAGNOSIS — Z0000 Encounter for general adult medical examination without abnormal findings: Secondary | ICD-10-CM | POA: Insufficient documentation

## 2020-06-01 NOTE — Progress Notes (Signed)
  Subjective:     Patient ID: Elaine Fernandez, female   DOB: [birth year], 54 y.o.   MRN: [id number]  HPI   Review of Systems     Objective:   Physical Exam Chest:     Breasts:        Right: No swelling, bleeding, inverted nipple, mass, nipple discharge, skin change or tenderness.        Left: No swelling, bleeding, inverted nipple, mass, nipple discharge, skin change or tenderness.        Assessment:     54 year old Hispanic patient presents for BCCCP clinic visit.  Delos Haring interpreted exam. Patient screened, and meets BCCCP eligibility.  Patient does not have insurance, Medicare or Medicaid. Instructed patient on breast self awareness using teach back method.  Clinical breast exam unremarkable.  No mass or lump palpated.  Patient had complete hysterectomy 2014.    Plan:     Sent for bilateral screening mammogram.

## 2020-06-03 ENCOUNTER — Encounter: Payer: Self-pay | Admitting: Family

## 2020-06-03 NOTE — Telephone Encounter (Signed)
Printed letter and mailed to patient informing patient we had been unable to reach her to locate correct dentist phone number. Note detailed that simple dental procedures are routinely low risk do not require cardiovascular clearance. If dentist was needing additional information, our phone and fax numbers were included on the mailed letter. Okay to remove from work queue.   Alver Sorrow, NP

## 2020-06-09 ENCOUNTER — Other Ambulatory Visit: Payer: Self-pay | Admitting: *Deleted

## 2020-06-09 ENCOUNTER — Inpatient Hospital Stay
Admission: RE | Admit: 2020-06-09 | Discharge: 2020-06-09 | Disposition: A | Discharge status: Home / Self Care | Payer: Self-pay | Source: Ambulatory Visit | Attending: *Deleted | Admitting: *Deleted

## 2020-06-09 DIAGNOSIS — Z1231 Encounter for screening mammogram for malignant neoplasm of breast: Secondary | ICD-10-CM

## 2020-06-14 NOTE — Progress Notes (Signed)
Letter mailed from [facility] to notify of normal mammogram results.  Patient to return in one year for annual screening.  Copy to [facility]. 

## 2021-06-12 IMAGING — MG DIGITAL SCREENING BILAT W/ TOMO W/ CAD
8 series · 8 of 24 positions shown · non-contrast
Comparison: Previous exam(s).

CLINICAL DATA: Screening.

EXAM:
DIGITAL SCREENING BILATERAL MAMMOGRAM WITH TOMO AND CAD

[R MLO synth-2D]
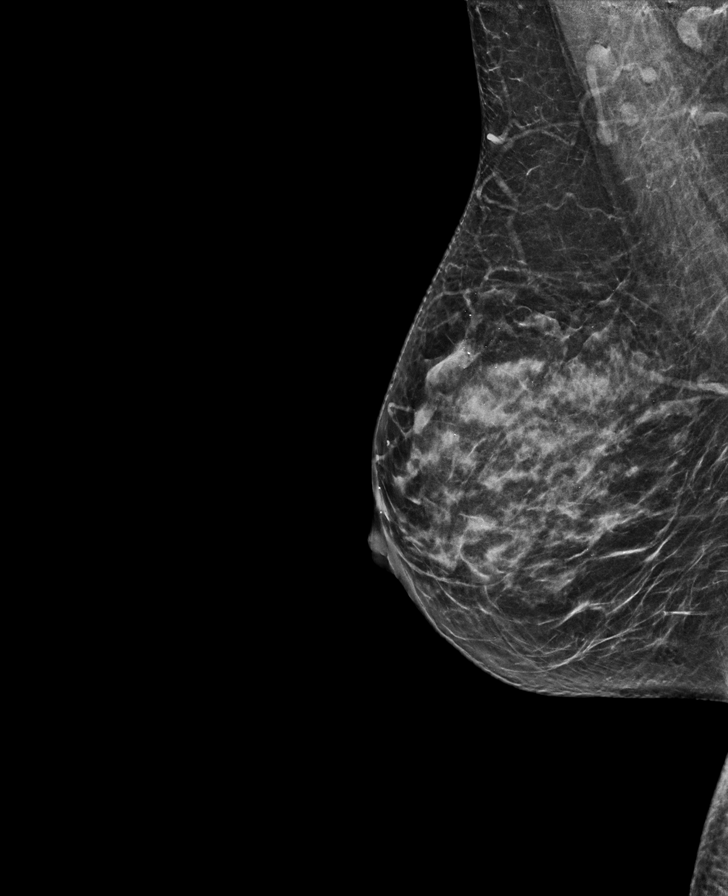

[R CC synth-2D]
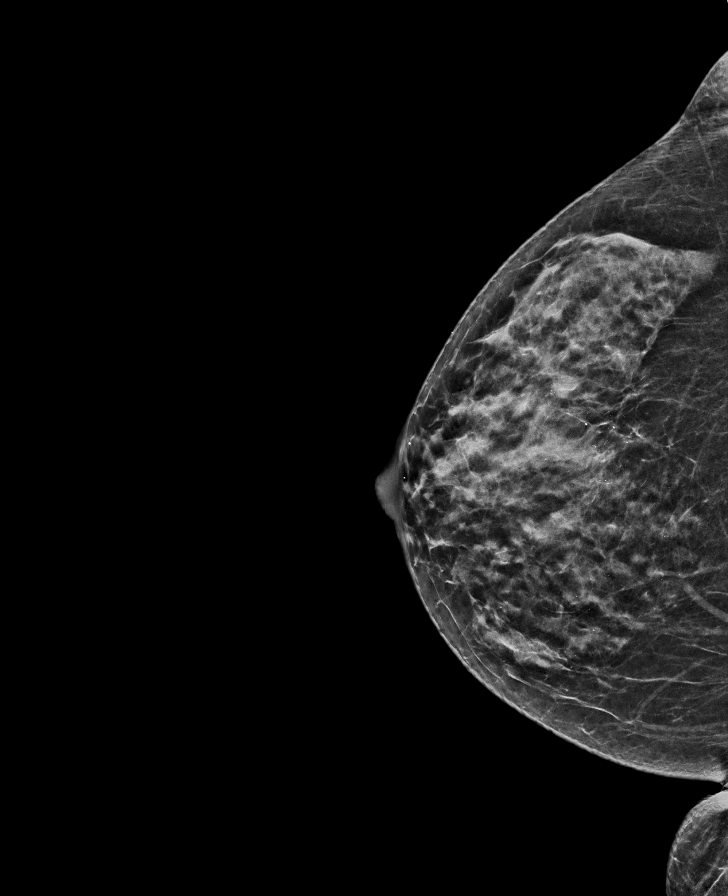

[L MLO synth-2D]
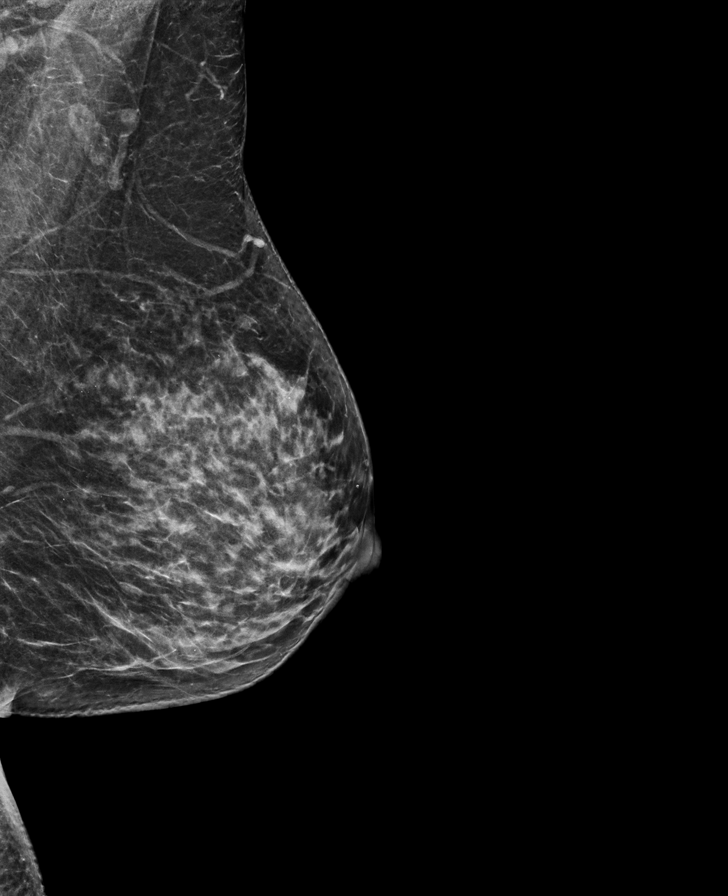

[L CC synth-2D]
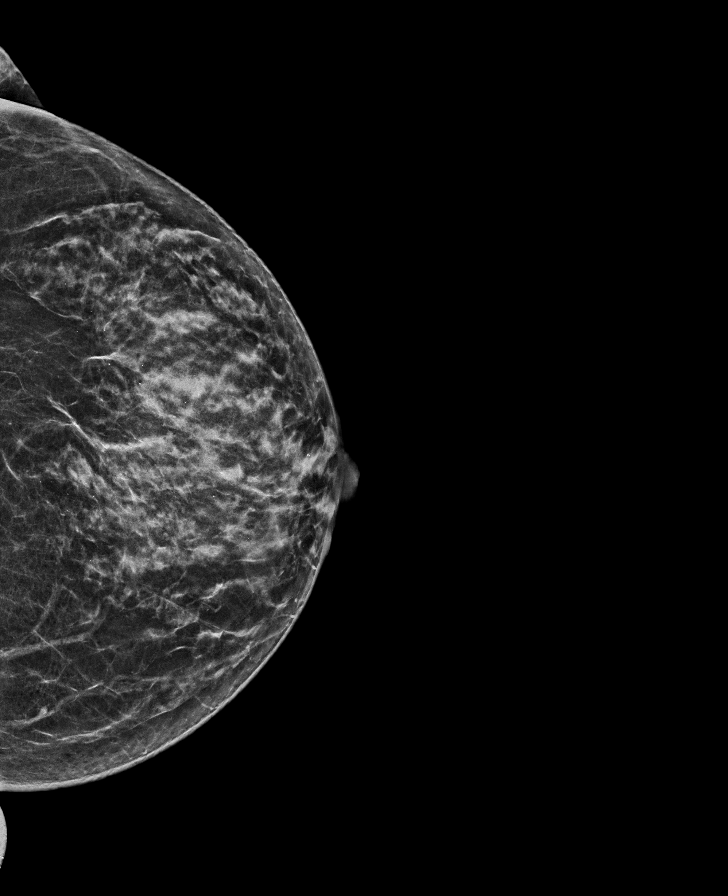

[L CC tomo · tomo slice 30/59.0]
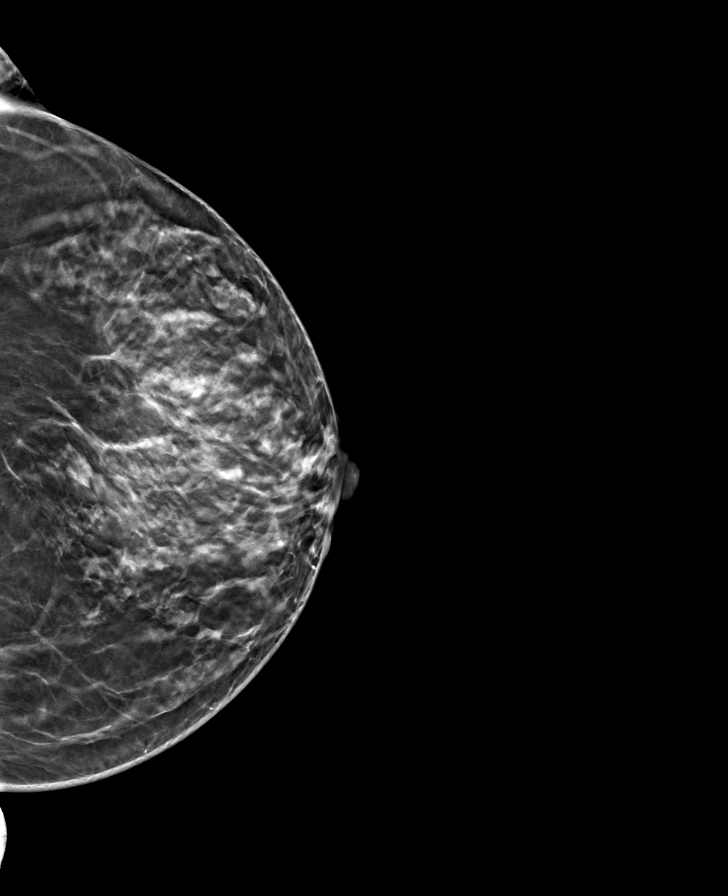

[R CC tomo · tomo slice 29/56.0]
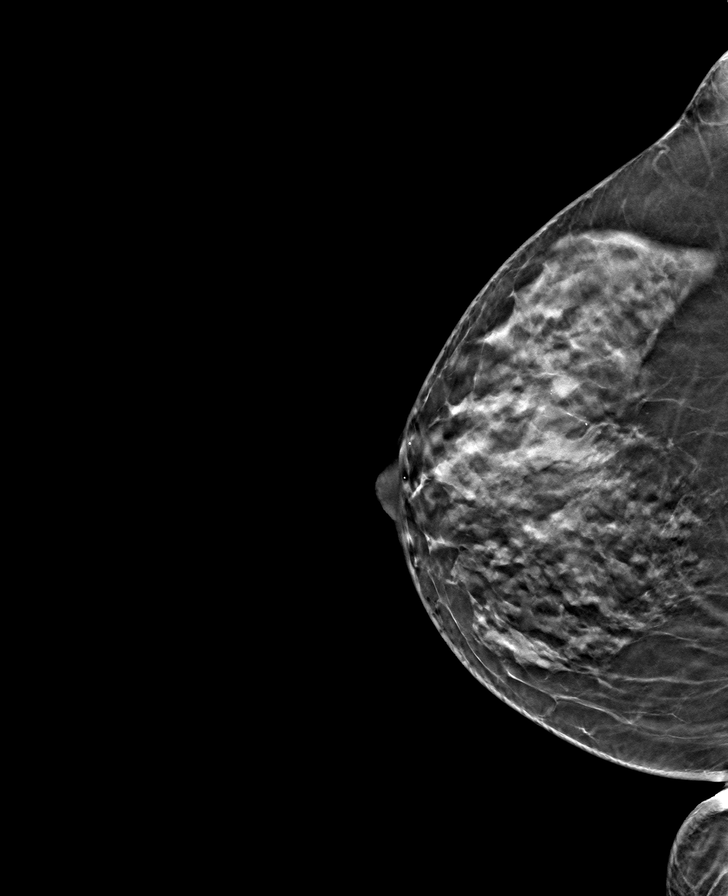

[L MLO tomo · tomo slice 34/67.0]
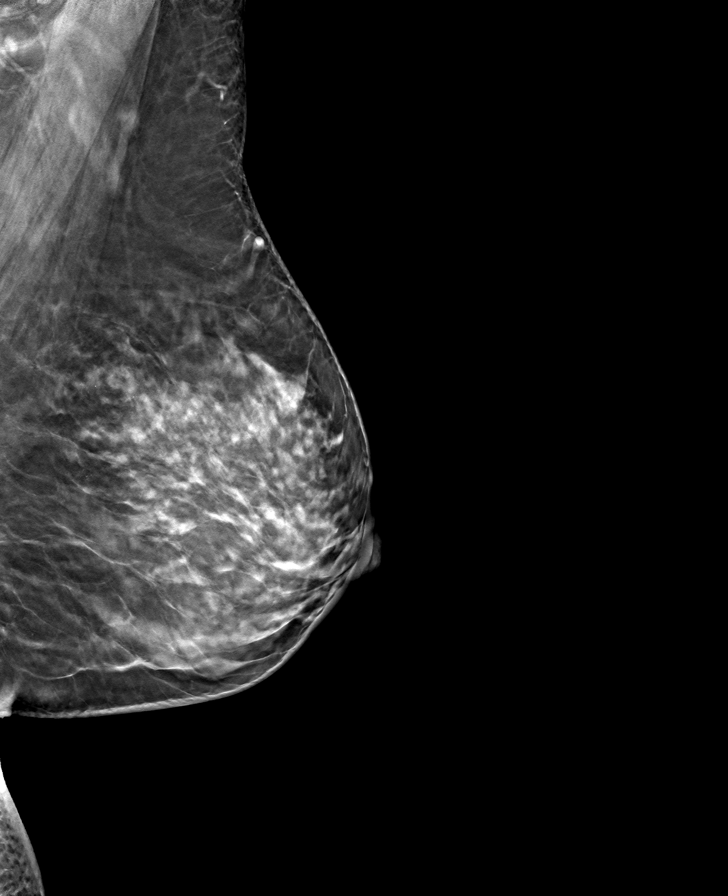

[R MLO tomo · tomo slice 31/60.0]
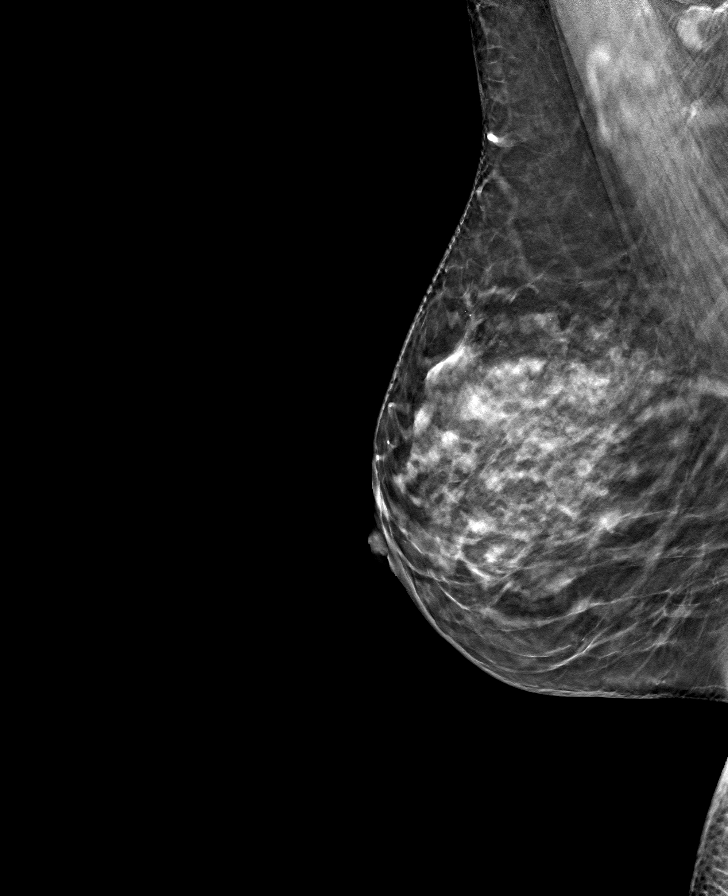

[8 of 24 positions shown; findings below may reference images not displayed]

ACR Breast Density Category c: The breast tissue is heterogeneously
dense, which may obscure small masses.
FINDINGS: There are no findings suspicious for malignancy. Images were
processed with CAD.
IMPRESSION: No mammographic evidence of malignancy. A result letter of this
screening mammogram will be mailed directly to the patient.

RECOMMENDATION:
Screening mammogram in one year. (Code:[id number])

BI-RADS CATEGORY  1: Negative.

## 2022-04-05 ENCOUNTER — Encounter: Payer: Self-pay | Admitting: *Deleted

## 2022-04-12 NOTE — Progress Notes (Signed)
Cardiology Office Note  Date:  04/13/2022   ID:  Elaine Fernandez, DOB 1966-09-18, MRN [id number]  PCP:  Resa Miner, MD   Chief Complaint  Patient presents with   Shortness of Breath        Chest Pain    With L arm pain   Leg Pain   Dizziness    All sx ongoing since 11/2021. H/o MI 2020    HPI:  Elaine Fernandez is a 56 y.o. 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 Last seen in clinic August 2021, patient of Dr. Okey Dupre  started on Imdur 11/11/19 when seen in the office for chest pain. ED visit the previous day with unrevealing workup.   On today's visit she reports having left chest pain, hurts on palpation,  Works in Engineer, drilling, lifting boxes, chronic repetitive motion/folding screens Reported occasional episode of lightheadedness bending down and picking up boxes at work  [employer] 6 months Thinks that her breathing was better on the Imdur, would like to restart  Scheduled to have visit with primary care next week, lab work  EKG personally reviewed by myself on todays visit Nsr rate 59 bpm no significant ST or T wave changes  PMH:   has a past medical history of Diastolic dysfunction, Dysmenorrhea, Hyperlipidemia LDL goal <70, Menorrhagia, and Spontaneous dissection of coronary artery.  PSH:    Past Surgical History:  Procedure Laterality Date   ABDOMINAL HYSTERECTOMY     CHOLECYSTECTOMY     HERNIA REPAIR     LEFT HEART CATH AND CORONARY ANGIOGRAPHY N/A 08/06/2018   Procedure: LEFT HEART CATH AND CORONARY ANGIOGRAPHY;  Surgeon: Yvonne Kendall, MD;  Location: [facility] INVASIVE CV LAB;  Service: Cardiovascular;  Laterality: N/A;    Current Outpatient Medications  Medication Sig Dispense Refill   aspirin 81 MG chewable tablet Chew 1 tablet (81 mg total) by mouth daily. (Patient not taking: Reported on  04/13/2022) 30 tablet 0   atorvastatin (LIPITOR) 40 MG tablet Take 1 tablet (40 mg total) by mouth daily at 6 PM. (Patient not taking: Reported on 04/13/2022) 30 tablet 11   nitroGLYCERIN (NITROSTAT) 0.4 MG SL tablet Place 1 tablet (0.4 mg total) under the tongue every 5 (five) minutes as needed for chest pain. (Patient not taking: Reported on 04/13/2022) 100 tablet 0   No current facility-administered medications for this visit.    Allergies:   Amoxicillin   Social History:  The patient  reports that she has never smoked. She has never used smokeless tobacco. She reports that she does not drink alcohol and does not use drugs.   Family History:   family history includes CAD in her father and mother; Diabetes in her father and mother; Heart attack in her father and mother.    Review of Systems: Review of Systems  Constitutional: Negative.   HENT: Negative.    Respiratory: Negative.    Cardiovascular:  Positive for chest pain.  Gastrointestinal: Negative.   Musculoskeletal: Negative.   Neurological: Negative.   Psychiatric/Behavioral: Negative.    All other systems reviewed and are negative.   PHYSICAL EXAM: VS:  BP 138/90 (BP Location: Left Arm, Patient Position: Sitting)   Pulse 67   Ht 5\' 5"  (1.651 m)   Wt 188 lb (85.3 kg)   SpO2 96%   BMI 31.28 kg/m  , BMI  Body mass index is 31.28 kg/m. GEN: Well nourished, well developed, in no acute distress HEENT: normal Neck: no JVD, carotid bruits, or masses Cardiac: RRR; no murmurs, rubs, or gallops,no edema  Respiratory:  clear to auscultation bilaterally, normal work of breathing GI: soft, nontender, nondistended, + BS MS: no deformity or atrophy Skin: warm and dry, no rash Neuro:  Strength and sensation are intact Psych: euthymic mood, full affect  Recent Labs: No results found for requested labs within last 365 days.    Lipid Panel Lab Results  Component Value Date   CHOL 185 08/06/2018   HDL 40 (L) 08/06/2018   LDLCALC  118 (H) 08/06/2018   TRIG 133 08/06/2018      Wt Readings from Last 3 Encounters:  04/13/22 188 lb (85.3 kg)  06/01/20 168 lb 11.2 oz (76.5 kg)  05/23/20 168 lb (76.2 kg)     ASSESSMENT AND PLAN:  Problem List Items Addressed This Visit       Cardiology Problems   Hyperlipidemia LDL goal <70   Other Visit Diagnoses     Coronary artery dissection    -  Primary   Relevant Orders   EKG 12-Lead   Chest pain in adult       Relevant Orders   EKG 12-Lead   EKG 12-Lead   DOE (dyspnea on exertion)       Relevant Orders   EKG 12-Lead      Coronary disease/coronary dissection No recent events, recommend she restart isosorbide 30 mg daily She felt her breathing was better on the isosorbide Normal EKG otherwise normal clinical exam Atypical left-sided chest pain, appears to be musculoskeletal and likely exacerbated by work and lifting  Hyperlipidemia Reports that she is scheduled to see primary care next week Recommend she have lab work done at [facility] She is self-pay  Discussed above with translator Follow-up with Dr. Okey Dupre her primary cardiologist  Total encounter time more than 30 minutes  Greater than 50% was spent in counseling and coordination of care with the patient    Signed, Dossie Arbour, M.D., Ph.D. [facility] [place], [place] [phone]

## 2022-04-13 ENCOUNTER — Ambulatory Visit (INDEPENDENT_AMBULATORY_CARE_PROVIDER_SITE_OTHER): Payer: Self-pay | Admitting: Cardiovascular Disease

## 2022-04-13 ENCOUNTER — Encounter: Payer: Self-pay | Admitting: Cardiovascular Disease

## 2022-04-13 VITALS — BP 138/90 | HR 67 | Ht 65.0 in | Wt 188.0 lb

## 2022-04-13 DIAGNOSIS — R0609 Other forms of dyspnea: Secondary | ICD-10-CM

## 2022-04-13 DIAGNOSIS — I2542 Coronary artery dissection: Secondary | ICD-10-CM

## 2022-04-13 DIAGNOSIS — R079 Chest pain, unspecified: Secondary | ICD-10-CM

## 2022-04-13 DIAGNOSIS — E785 Hyperlipidemia, unspecified: Secondary | ICD-10-CM

## 2022-04-13 MED ORDER — ISOSORBIDE MONONITRATE ER 30 MG PO TB24: 30.0000 mg | ORAL_TABLET | Freq: Every day | ORAL | 3 refills | Status: DC

## 2022-04-13 NOTE — Patient Instructions (Addendum)
Medication Instructions:  Please restart isosorbide mononitrate (imdur) 30 mg daily  If you need a refill on your cardiac medications before your next appointment, please call your pharmacy.   Lab work: Please request labs with primary care  Testing/Procedures: No new testing needed  Follow-Up: At [facility], you and your health needs are our priority.  As part of our continuing mission to provide you with exceptional heart care, we have created designated Provider Care Teams.  These Care Teams include your primary Cardiologist (physician) and Advanced Practice Providers (APPs -  Physician Assistants and Nurse Practitioners) who all work together to provide you with the care you need, when you need it.  You will need a follow up appointment in 12 months, f/u with Dr. Okey Dupre  Providers on your designated Care Team:   Nicolasa Ducking, NP Eula Listen, PA-C Cadence Fransico Michael, [place]  COVID-19 Vaccine Information can be found at: [email or web address] For questions related to vaccine distribution or appointments, please email [email or web address] or call [phone].

## 2022-11-14 ENCOUNTER — Other Ambulatory Visit: Payer: Self-pay

## 2022-11-14 DIAGNOSIS — Z1231 Encounter for screening mammogram for malignant neoplasm of breast: Secondary | ICD-10-CM

## 2022-12-17 ENCOUNTER — Ambulatory Visit: Payer: Self-pay | Attending: Hematology and Oncology | Admitting: Hematology and Oncology

## 2022-12-17 ENCOUNTER — Ambulatory Visit
Admission: RE | Admit: 2022-12-17 | Discharge: 2022-12-17 | Disposition: A | Discharge status: Home / Self Care | Payer: Self-pay | Source: Ambulatory Visit | Attending: Obstetrics and Gynecology | Admitting: Obstetrics and Gynecology

## 2022-12-17 VITALS — BP 113/74 | Wt 188.6 lb

## 2022-12-17 DIAGNOSIS — Z1231 Encounter for screening mammogram for malignant neoplasm of breast: Secondary | ICD-10-CM | POA: Insufficient documentation

## 2022-12-17 NOTE — Progress Notes (Signed)
[name] is Fernandez 57 y.o. female who presents to [facility] clinic today with no complaints.    Pap Smear: Pap not smear completed today due to hysterectomy.   Physical exam: Breasts Breasts symmetrical. No skin abnormalities bilateral breasts. No nipple retraction bilateral breasts. No nipple discharge bilateral breasts. No lymphadenopathy. No lumps palpated bilateral breasts.     MS DIGITAL SCREENING TOMO BILATERAL  Result Date: 06/09/2020 CLINICAL DATA:  Screening. EXAM: DIGITAL SCREENING BILATERAL MAMMOGRAM WITH TOMO AND CAD COMPARISON:  Previous exam(s). ACR Breast Density Category c: The breast tissue is heterogeneously dense, which may obscure small masses. FINDINGS: There are no findings suspicious for malignancy. Images were processed with CAD. IMPRESSION: No mammographic evidence of malignancy. Fernandez result letter of this screening mammogram will be mailed directly to the patient. RECOMMENDATION: Screening mammogram in one year. (Code:[id number]) BI-RADS CATEGORY  1: Negative. Electronically Signed   By: Elaine Fernandez M.D.   On: 06/09/2020 12:58      Pelvic/Bimanual Pap is not indicated today    Smoking History: Patient has never smoked and was not referred to quit line.    Patient Navigation: Patient education provided. Access to services provided for patient through [facility] interpreter provided. No transportation provided   Colorectal Cancer Screening: Per patient has never had colonoscopy completed No complaints today. FIT test given per [facility]   Breast and Cervical Cancer Risk Assessment: Patient does not have family history of breast cancer, known genetic mutations, or radiation treatment to the chest before age 79. Patient does not have history of cervical dysplasia, immunocompromised, or DES exposure in-utero.  Risk Assessment   No risk assessment data for the current encounter  Risk Scores       06/01/2020   Last edited by: Elaine Fernandez,  CMA   5-year risk: 0.5 %   Lifetime risk: 4 %            Fernandez: BCCCP exam without pap smear No complaints with benign exam.   P: Referred patient to the [facility] for Fernandez screening mammogram. Appointment scheduled 12/17/22.  Elaine Scrape A, NP 12/17/2022 1:40 PM

## 2022-12-17 NOTE — Patient Instructions (Signed)
Taught Elaine Fernandez about self breast awareness and gave educational materials to take home. Patient did not need a Pap smear today due hysterectomy. Referred patient to the [facility] for diagnostic mammogram. Appointment scheduled for 12/17/22. Patient aware of appointment and will be there. Let patient know will follow up with her within the next couple weeks with results. Elaine Fernandez verbalized understanding.  Melodye Ped, NP 1:42 PM

## 2022-12-19 ENCOUNTER — Other Ambulatory Visit: Payer: Self-pay | Admitting: Obstetrics and Gynecology

## 2022-12-19 DIAGNOSIS — N63 Unspecified lump in unspecified breast: Secondary | ICD-10-CM

## 2022-12-19 DIAGNOSIS — R928 Other abnormal and inconclusive findings on diagnostic imaging of breast: Secondary | ICD-10-CM

## 2022-12-25 ENCOUNTER — Ambulatory Visit
Admission: RE | Admit: 2022-12-25 | Discharge: 2022-12-25 | Disposition: A | Discharge status: Home / Self Care | Payer: Self-pay | Source: Ambulatory Visit | Attending: Obstetrics and Gynecology | Admitting: Obstetrics and Gynecology

## 2022-12-25 DIAGNOSIS — N63 Unspecified lump in unspecified breast: Secondary | ICD-10-CM

## 2022-12-25 DIAGNOSIS — R928 Other abnormal and inconclusive findings on diagnostic imaging of breast: Secondary | ICD-10-CM | POA: Insufficient documentation

## 2023-11-11 ENCOUNTER — Ambulatory Visit: Payer: Self-pay | Attending: Student | Admitting: Student

## 2023-11-11 ENCOUNTER — Encounter: Payer: Self-pay | Admitting: Student

## 2023-11-11 VITALS — BP 117/70 | HR 70 | Ht 65.0 in | Wt 188.8 lb

## 2023-11-11 DIAGNOSIS — I25118 Atherosclerotic heart disease of native coronary artery with other forms of angina pectoris: Secondary | ICD-10-CM

## 2023-11-11 DIAGNOSIS — R0602 Shortness of breath: Secondary | ICD-10-CM

## 2023-11-11 DIAGNOSIS — E785 Hyperlipidemia, unspecified: Secondary | ICD-10-CM

## 2023-11-11 DIAGNOSIS — R079 Chest pain, unspecified: Secondary | ICD-10-CM

## 2023-11-11 MED ORDER — ISOSORBIDE MONONITRATE ER 30 MG PO TB24: 15.0000 mg | ORAL_TABLET | Freq: Every day | ORAL | 1 refills | Status: DC

## 2023-11-11 NOTE — Progress Notes (Signed)
Cardiology Clinic Note   Date: 11/11/2023 ID: Zoraida Havrilla, DOB September 23, 1966, MRN [id number]  Primary Cardiologist:  Yvonne Kendall, MD  Chief Complaint   Elaine Fernandez is a 58 y.o. female who presents to the clinic today for overdue follow up.   Patient Profile   Elaine Fernandez is followed by Dr. Okey Dupre for the history outlined below.      Past medical history significant for: CAD/SCAD. LHC 07/10/2018 (NSTEMI): Significant stenosis (90 to 99%) involving small branch of D1 and OM 3 which likely represent culprit lesions for the patient's NSTEMI.  Large epicardial coronary arteries are angiographically normal.  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  Focal mid inferior hypokinesis with otherwise preserved LV systolic function.  No indication for PCI given small vessel/lack of ongoing symptoms. Consider renal artery ultrasound to exclude fibromuscular dysplasia. Renal artery ultrasound 08/27/2018: Normal size, resistive index, cortical thickness and renal arteries bilaterally.  Normal celiac and superior mesenteric arteries. Echo 12/08/2018: EF 60-65%.  No RWMA.  Normal diastolic parameters.  Normal RV size/function.  No significant valvular abnormalities. Hyperlipidemia. Gastritis. T2DM.   In summary, patient has a history of CAD.  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  Echo at that time showed EF 55 to 60%, RWMA could not be excluded, Grade I DD, normal RV size/function, mild LAE, no significant valvular abnormalities.  She underwent renal artery ultrasound to exclude fibromuscular dysplasia which was normal.  Repeat echo March 2020 showed EF 60 to 65% as detailed above.  In February 2021, patient presented to the ED for chest pain and shortness of breath that developed after finding out her brother had passed away from COVID.  Symptoms resolved after SL NTG x 1.  EKG shows sinus  rhythm with no significant abnormality (baseline wander limited evaluation).  Troponin negative.  She followed up in the office and reported continued intermittent mild central chest discomfort.  It was recommended she presents to the ED via she declined and agreed to starting isosorbide.  She did not want to retrial beta-blocker as she felt fatigued and shortness of breath with taking it in the past.  In August 2021 patient reported isosorbide discontinued by PCP secondary to lightheadedness.  She reported this did not help her lightheadedness but declined restarting isosorbide.  Patient was last seen in the office by Dr. Mariah Milling on 04/13/2022 with complaints of chest pain.  Patient was found tenderness to palpation on the left side of her chest.  She reported working at [employer] and doing a lot of of lifting of heavy boxes.  It was recommended she restart isosorbide.     History of Present Illness    Today, patient is accompanied by her husband. Visit is assisted by [facility] interpreter Claretha Cooper. Patient reports chest pressure and shortness of breath that began in October. Symptoms are described as central discomfort with radiation down left arm that typically starts in the afternoon or evening after eating and continues through the night until she wakes in the morning. Pain is worse when she lays on her left side and is relieved by lying on her right side. Symptoms occur 3-4 days a week. She reports massaging the area with no relief. It is unclear if this pain is similar to the pain she has had in the past. She has not been taking isosorbide for 1 year and atorvastatin for about 7 months. She denies lower extremity  edema, orthopnea or PND. She does not do routine exercise. She stays busy at home performing light to heavy household activities. Pain does not usually occur when she does housework. She was recently diagnosed with diabetes and started on metformin.     ROS: All other systems reviewed and are  otherwise negative except as noted in History of Present Illness.  EKGs/Labs Reviewed    EKG Interpretation Date/Time:  Monday November 11 2023 14:53:19 EST Ventricular Rate:  70 PR Interval:  172 QRS Duration:  90 QT Interval:  400 QTC Calculation: 432 R Axis:   20  Text Interpretation: Normal sinus rhythm Normal ECG When compared with ECG of 04/13/2022 (not in [facility]) no significant change Confirmed by Carlos Levering [phone]) on 11/11/2023 2:56:24 PM   10/23/2023: WBC 6.2, HGB 14, glucose 109, sodium 139, K 4.5, CRT 0.83, BUN 10.          Physical Exam    VS:  BP 117/70 (BP Location: Left Arm, Patient Position: Sitting, Cuff Size: Normal)   Pulse 70   Ht 5\' 5"  (1.651 m)   Wt 188 lb 12.8 oz (85.6 kg)   SpO2 95%   BMI 31.42 kg/m  , BMI Body mass index is 31.42 kg/m.  GEN: Well nourished, well developed, in no acute distress. Neck: No JVD or carotid bruits. Cardiac:  RRR. No murmurs. No rubs or gallops.   Respiratory:  Respirations regular and unlabored. Clear to auscultation without rales, wheezing or rhonchi. GI: Soft, nontender, nondistended. Extremities: Radials/DP/PT 2+ and equal bilaterally. No clubbing or cyanosis. No edema.  Skin: Warm and dry, no rash. Neuro: Strength intact.  Assessment & Plan   CAD/Chest pain/shortness of breath.  LHC October 2019 in the setting of STEMI showed significant stenosis and was in D1 and OM3 with no PCI recommended secondary to small vessels, findings and history suggestive of multifocal spontaneous coronary dissection.  Echo March 2020 showed normal LV/RV function, normal diastolic parameters, no significant valvular abnormalities.  Patient reports a 4 month history of central chest pain with radiation to left arm and associated dyspnea that typically occurs after eating and lasts through the night. Pain is worsened with lying on her left side and relieved when she turns over to her right side. She has tried to massage the area and rest  without symptom relief. She is not taking atorvastatin but was recently given a new prescription for it. She has not been taking isosorbide for 1 year. She does light to heavy housework without pain. EKG today without acute changes. Given persistence of symptoms will get PET stress test to determine if there is a microvascular component to pain.  -Continue aspirin. -Restart isosorbide 15 mg at night and atorvastatin 40 mg.  -Pet Stress test.   Hyperlipidemia LDL February 2024 117, not at goal.  -Restart atorvastatin.   Disposition: Restart isosorbide 15 mg nightly and atorvastatin 40 mg. PET stress test. Return in 6 weeks after testing.      Informed Consent   Shared Decision Making/Informed Consent The risks [chest pain, shortness of breath, cardiac arrhythmias, dizziness, blood pressure fluctuations, myocardial infarction, stroke/transient ischemic attack, nausea, vomiting, allergic reaction, radiation exposure, metallic taste sensation and life-threatening complications (estimated to be 1 in 10,000)], benefits (risk stratification, diagnosing coronary artery disease, treatment guidance) and alternatives of a cardiac PET stress test were discussed in detail with Elaine Fernandez and she agrees to proceed.      Signed, Etta Grandchild. Jameka Ivie, DNP, NP-C

## 2023-11-11 NOTE — Patient Instructions (Addendum)
Medication Instructions:  - Your physician has recommended you make the following change in your medication:   1) START Imdur (Isosorbide) 30 mg: - take 0.5 tablet (15 mg) by mouth once daily   *If you need a refill on your cardiac medications before your next appointment, please call your pharmacy*   Lab Work: - none ordered  If you have labs (blood work) drawn today and your tests are completely normal, you will receive your results only by: MyChart Message (if you have MyChart) OR A paper copy in the mail If you have any lab test that is abnormal or we need to change your treatment, we will call you to review the results.   Testing/Procedures:  1) Cardiac PET Stress Test:  Your physician has requested that you have a Cardiac Pet Stress Test.   This testing is completed at [facility] ([address] [place], [place]). Please arrive 30 minutes prior to your scheduled time.   How to Prepare for Your Cardiac PET/CT Stress Test:  Nothing to eat or drink, except water, 3 hours prior to arrival time.  NO caffeine/decaffeinated products, or chocolate 12 hours prior to arrival. (Please note decaffeinated beverages (teas/coffees) still contain caffeine).  If you have caffeine within 12 hours prior, the test will need to be rescheduled.  Medication instructions: Do not take erectile dysfunction medications for 72 hours prior to test (sildenafil, tadalafil) Do not take isosorbide mononitrate or nitroglycerin the day before or day of test    You may take your remaining medications with water.  NO perfume, cologne or lotion on chest or abdomen area. FEMALES - Please avoid wearing dresses to this appointment.  Total time is 1 to 2 hours; you may want to bring reading material for the waiting time.  IF YOU THINK YOU MAY BE PREGNANT, OR ARE NURSING PLEASE INFORM THE TECHNOLOGIST.  In preparation for your appointment, medication and supplies will be  purchased.  Appointment availability is limited, so if you need to cancel or reschedule, please call the Radiology Department Scheduler at [phone] 24 hours in advance to avoid a cancellation fee of $100.00  What to Expect When you Arrive:  Once you arrive and check in for your appointment, you will be taken to a preparation room within the Radiology Department.  A technologist or Nurse will obtain your medical history, verify that you are correctly prepped for the exam, and explain the procedure.  Afterwards, an IV will be started in your arm and electrodes will be placed on your skin for EKG monitoring during the stress portion of the exam. Then you will be escorted to the PET/CT scanner.  There, staff will get you positioned on the scanner and obtain a blood pressure and EKG.  During the exam, you will continue to be connected to the EKG and blood pressure machines.  A small, safe amount of a radioactive tracer will be injected in your IV to obtain a series of pictures of your heart along with an injection of a stress agent.    After your Exam:  It is recommended that you eat a meal and drink a caffeinated beverage to counter act any effects of the stress agent.  Drink plenty of fluids for the remainder of the day and urinate frequently for the first couple of hours after the exam.  Your doctor will inform you of your test results within 7-10 business days.  For more information and frequently asked questions, please visit our  website: [email or web address]  For questions about your test or how to prepare for your test, please call: Cardiac Imaging Nurse Navigators Office: [phone]   The schedulers will call you to get this scheduled. Please follow further testing instructions below.    Follow-Up: At [facility], you and your health needs are our priority.  As part of our continuing mission to provide you with exceptional heart care, we have created designated  Provider Care Teams.  These Care Teams include your primary Cardiologist (physician) and Advanced Practice Providers (APPs -  Physician Assistants and Nurse Practitioners) who all work together to provide you with the care you need, when you need it.  We recommend signing up for the patient portal called "MyChart".  Sign up information is provided on this After Visit Summary.  MyChart is used to connect with patients for Virtual Visits (Telemedicine).  Patients are able to view lab/test results, encounter notes, upcoming appointments, etc.  Non-urgent messages can be sent to your provider as well.   To learn more about what you can do with MyChart, go to [email or web address].    Your next appointment:   6 week(s)  Provider:   You may see Yvonne Kendall, MD or one of the following Advanced Practice Providers on your designated Care Team:    Carlos Levering, NP    Other Instructions N/a

## 2023-12-10 ENCOUNTER — Telehealth (HOSPITAL_COMMUNITY): Payer: Self-pay | Admitting: *Deleted

## 2023-12-10 NOTE — Telephone Encounter (Signed)
 Reaching out to patient (Via Spanish interpreter Jed Limerick ID [phone]) to offer assistance regarding upcoming cardiac imaging study; pt verbalizes understanding of appt date/time, parking situation and where to check in, pre-test NPO status, and verified current allergies; name and call back number provided for further questions should they arise  Elaine Brick RN Navigator Cardiac Imaging [facility] [phone] office [phone] cell  Patient aware to avoid caffeine and Imdur prior to her cardiac PET scan.

## 2023-12-12 ENCOUNTER — Ambulatory Visit
Admission: RE | Admit: 2023-12-12 | Discharge: 2023-12-12 | Disposition: A | Discharge status: Home / Self Care | Payer: Self-pay | Source: Ambulatory Visit | Attending: Student | Admitting: Student

## 2023-12-12 DIAGNOSIS — R079 Chest pain, unspecified: Secondary | ICD-10-CM

## 2023-12-12 DIAGNOSIS — K760 Fatty (change of) liver, not elsewhere classified: Secondary | ICD-10-CM | POA: Insufficient documentation

## 2023-12-12 DIAGNOSIS — I25118 Atherosclerotic heart disease of native coronary artery with other forms of angina pectoris: Secondary | ICD-10-CM

## 2023-12-12 LAB — NM PET CT CARDIAC PERFUSION MULTI W/ABSOLUTE BLOODFLOW
LV dias vol: 89 mL (ref 46–106)
MBFR: 3.01
Nuc Rest EF: 60 %
Nuc Stress EF: 72 %
Peak HR: 108 {beats}/min
Rest HR: 68 {beats}/min
Rest MBF: 0.77 ml/g/min
Rest Nuclear Isotope Dose: 22.1 mCi
SRS: 0
SSS: 0
ST Depression (mm): 0 mm
Stress MBF: 2.32 ml/g/min
Stress Nuclear Isotope Dose: 22.1 mCi
TID: 0.91

## 2023-12-12 MED ORDER — REGADENOSON 0.4 MG/5ML IV SOLN
0.4000 mg | Freq: Once | INTRAVENOUS | Status: AC
  Administered 2023-12-12: 0.4 mg via INTRAVENOUS
  Filled 2023-12-12: qty 5

## 2023-12-12 MED ORDER — RUBIDIUM RB82 GENERATOR (RUBYFILL)
22.0500 | PACK | Freq: Once | INTRAVENOUS | Status: AC
  Administered 2023-12-12: 22.05 via INTRAVENOUS

## 2023-12-12 MED ORDER — RUBIDIUM RB82 GENERATOR (RUBYFILL)
22.0600 | PACK | Freq: Once | INTRAVENOUS | Status: AC
  Administered 2023-12-12: 22.06 via INTRAVENOUS

## 2023-12-12 MED ORDER — REGADENOSON 0.4 MG/5ML IV SOLN
INTRAVENOUS | Status: AC
  Filled 2023-12-12: qty 5

## 2023-12-12 NOTE — Progress Notes (Signed)
 Patient presents for a cardiac PET stress test and tolerated procedure with some dizziness that resolved by the conclusion of the test. Patient maintained acceptable vital signs throughout the test and was offered caffeine after test.  Patient ambulated out of department with a steady gait.

## 2023-12-13 ENCOUNTER — Other Ambulatory Visit: Payer: Self-pay

## 2023-12-13 DIAGNOSIS — Z1231 Encounter for screening mammogram for malignant neoplasm of breast: Secondary | ICD-10-CM

## 2023-12-28 NOTE — Progress Notes (Unsigned)
 Cardiology Clinic Note   Date: 01/01/2024 ID: Elaine Fernandez, DOB January 11, 1966, MRN [id number]  Primary Cardiologist:  Yvonne Kendall, MD  Chief Complaint   Elaine Fernandez is a 58 y.o. female who presents to the clinic today for follow up after testing.   Patient Profile   Elaine Fernandez is followed by Dr. Okey Dupre for the history outlined below.       Past medical history significant for: CAD/SCAD. LHC 07/10/2018 (NSTEMI): Significant stenosis (90 to 99%) involving small branch of D1 and OM 3 which likely represent culprit lesions for the patient's NSTEMI.  Large epicardial coronary arteries are angiographically normal.  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  Focal mid inferior hypokinesis with otherwise preserved LV systolic function.  No indication for PCI given small vessel/lack of ongoing symptoms. Consider renal artery ultrasound to exclude fibromuscular dysplasia. Renal artery ultrasound 08/27/2018: Normal size, resistive index, cortical thickness and renal arteries bilaterally.  Normal celiac and superior mesenteric arteries. Echo 12/08/2018: EF 60-65%.  No RWMA.  Normal diastolic parameters.  Normal RV size/function.  No significant valvular abnormalities. Cardiac PET/CT 12/12/2023: Normal LV perfusion.  No evidence of ischemia.  No evidence of infarction.  Normal, low risk study. Hyperlipidemia. Gastritis. T2DM.   In summary, patient has a history of CAD.  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  Echo at that time showed EF 55 to 60%, RWMA could not be excluded, Grade I DD, normal RV size/function, mild LAE, no significant valvular abnormalities.  She underwent renal artery ultrasound to exclude fibromuscular dysplasia which was normal.  Repeat echo March 2020 showed EF 60 to 65% as detailed above.  In February 2021, patient presented to the ED for chest pain and shortness of  breath that developed after finding out her brother had passed away from COVID.  Symptoms resolved after SL NTG x 1.  EKG shows sinus rhythm with no significant abnormality (baseline wander limited evaluation).  Troponin negative.  She followed up in the office and reported continued intermittent mild central chest discomfort.  It was recommended she presents to the ED via she declined and agreed to starting isosorbide.  She did not want to retrial beta-blocker as she felt fatigued and shortness of breath with taking it in the past.  In August 2021 patient reported isosorbide discontinued by PCP secondary to lightheadedness.  She reported this did not help her lightheadedness but declined restarting isosorbide.  Patient was last seen in the office by Dr. Mariah Milling on 04/13/2022 with complaints of chest pain.  Patient was found tenderness to palpation on the left side of her chest.  She reported working at [employer] and doing a lot of of lifting of heavy boxes.  It was recommended she restart isosorbide.   Patient was last seen in the office by by me on 11/11/2023 for overdue follow-up.  She reported episodes of chest pressure and shortness of breath that began in October 2024.  She describes central chest discomfort with radiation down left arm that typically started in the afternoon or evening after eating and continued through the night.  Pain was worsened with lying on her left side and relieved with lying on her right side and occurred 3-4 times a week.  It was unclear if the pain she was experiencing was similar to pain in the past.  She reported not taking isosorbide x 1 year and atorvastatin x 7 months.  She was restarted on isosorbide and atorvastatin and PET stress was ordered.  She underwent PET stress in March 2025 which was a normal, low risk study.     History of Present Illness    Today, patient is here alone. Communication is assisted by in person [facility] [employer]. Patient is doing  well. She is no longer experiencing chest pain. She is not taking isosorbide as it caused dizziness. She reports improving DOE since starting a walking program. No lower extremity edema, orthopnea or PND. She walks at the park daily for a minimum of 30 minutes. Her tolerance for this is increasing. She is also working on losing weight. She is sleeping better since starting over the counter magnesium citrate. Discussed the results of PET stress in detail. All questions answered.     ROS: All other systems reviewed and are otherwise negative except as noted in History of Present Illness.  EKGs/Labs Reviewed        EKG not ordered today.       Physical Exam    VS:  BP (!) 132/98 (BP Location: Left Arm, Patient Position: Sitting, Cuff Size: Normal)   Pulse 68   Ht 5\' 5"  (1.651 m)   Wt 185 lb 6 oz (84.1 kg)   SpO2 98%   BMI 30.85 kg/m  , BMI Body mass index is 30.85 kg/m.  GEN: Well nourished, well developed, in no acute distress. Neck: No JVD or carotid bruits. Cardiac:  RRR. No murmurs. No rubs or gallops.   Respiratory:  Respirations regular and unlabored. Clear to auscultation without rales, wheezing or rhonchi. GI: Soft, nontender, nondistended. Extremities: Radials/DP/PT 2+ and equal bilaterally. No clubbing or cyanosis. No edema.  Skin: Warm and dry, no rash. Neuro: Strength intact.  Assessment & Plan   CAD/Chest pain/shortness of breath.  LHC October 2019 in the setting of STEMI showed significant stenosis and was in D1 and OM3 with no PCI recommended secondary to small vessels, findings and history suggestive of multifocal spontaneous coronary dissection.  Echo March 2020 showed normal LV/RV function, normal diastolic parameters, no significant valvular abnormalities.  Cardiac PET/CT 12/12/2023 showed normal LV perfusion, no evidence of ischemia/infarction, normal, low risk study.  Patient reports no further chest pain. She is not taking isosorbide, as it caused dizziness. She  reports DOE is improving since starting a walking program. She walks at the park for a minimum of 30 minutes daily with improving tolerance. She has also improved her diet and is working on losing weight.  -Continue aspirin, atorvastatin, isosorbide.   Hyperlipidemia LDL February 2024 117, not at goal.  -Restart atorvastatin. -Repeat lipid panel and LFTs in April 2025.  Disposition: Lipid panel and LFTs next month. Return in 6 months or sooner as needed.          Signed, Etta Grandchild. Skylynne Schlechter, DNP, NP-C

## 2023-12-30 ENCOUNTER — Ambulatory Visit: Payer: Self-pay | Attending: Hematology and Oncology | Admitting: Hematology and Oncology

## 2023-12-30 ENCOUNTER — Ambulatory Visit
Admission: RE | Admit: 2023-12-30 | Discharge: 2023-12-30 | Disposition: A | Discharge status: Home / Self Care | Payer: Self-pay | Source: Ambulatory Visit | Attending: Obstetrics and Gynecology | Admitting: Obstetrics and Gynecology

## 2023-12-30 VITALS — BP 140/83 | Wt 185.8 lb

## 2023-12-30 DIAGNOSIS — Z1231 Encounter for screening mammogram for malignant neoplasm of breast: Secondary | ICD-10-CM

## 2023-12-30 NOTE — Progress Notes (Signed)
 [name] is a 58 y.o. female who presents to [facility] clinic today with no complaints.    Pap Smear: Pap not smear completed today due to hysterectomy for benign reasons.   Physical exam: Breasts Breasts symmetrical. No skin abnormalities bilateral breasts. No nipple retraction bilateral breasts. No nipple discharge bilateral breasts. No lymphadenopathy. No lumps palpated bilateral breasts.       Pelvic/Bimanual Pap is not indicated today    Smoking History: Patient has never smoked and was not referred to quit line.    Patient Navigation: Patient education provided. Access to services provided for patient through BCCCP program. Elaine Fernandez interpreter provided. No transportation provided   Colorectal Cancer Screening: Per patient has never had colonoscopy completed No complaints today. Has been given testing per [facility] and will complete tomorrow.    Breast and Cervical Cancer Risk Assessment: Patient does not have family history of breast cancer, known genetic mutations, or radiation treatment to the chest before age 25. Patient does not have history of cervical dysplasia, immunocompromised, or DES exposure in-utero.  Risk Scores as of Encounter on 12/30/2023     Elaine Fernandez           5-year 0.93%   Lifetime 6.55%   This patient is [place]/Lat[place] but has no documented birth country, so the [place] model used data from [place] patients to calculate their risk score. Document a birth country in the Demographics activity for a more accurate score.         Last calculated by Narda Rutherford, LPN on 1/61/0960 at 10:18 AM        A: BCCCP exam without pap smear No complaints with benign exam.   P: Referred patient to the [facility] for a screening mammogram. Appointment scheduled 12/30/2023.  Ilda Basset A, NP 12/30/2023 10:40 AM

## 2023-12-30 NOTE — Patient Instructions (Signed)
 Taught Elaine Fernandez about self breast awareness and gave educational materials to take home. Patient did not need a Pap smear today due to benign hysterectomy. Referred patient to the [facility] for screening mammogram. Appointment scheduled for 12/30/2023. Patient aware of appointment and will be there. Let patient know will follow up with her within the next couple weeks with results. Elaine Fernandez verbalized understanding.  Pascal Lux, NP 10:46 AM

## 2024-01-01 ENCOUNTER — Ambulatory Visit: Payer: Self-pay | Attending: Student | Admitting: Student

## 2024-01-01 ENCOUNTER — Encounter: Payer: Self-pay | Admitting: Student

## 2024-01-01 VITALS — BP 132/98 | HR 68 | Ht 65.0 in | Wt 185.4 lb

## 2024-01-01 DIAGNOSIS — Z79899 Other long term (current) drug therapy: Secondary | ICD-10-CM

## 2024-01-01 DIAGNOSIS — I2510 Atherosclerotic heart disease of native coronary artery without angina pectoris: Secondary | ICD-10-CM

## 2024-01-01 DIAGNOSIS — R0789 Other chest pain: Secondary | ICD-10-CM

## 2024-01-01 DIAGNOSIS — R0609 Other forms of dyspnea: Secondary | ICD-10-CM

## 2024-01-01 DIAGNOSIS — E785 Hyperlipidemia, unspecified: Secondary | ICD-10-CM

## 2024-01-01 NOTE — Patient Instructions (Addendum)
 Medication Instructions:  Your Physician recommend you continue on your current medication as directed.    *If you need a refill on your cardiac medications before your next appointment, please call your pharmacy*   Lab Work: Your provider would like for you to return in 2-3 weeks to have the following labs drawn: Lipid Panel and Liver function test.   Please go to [facility] [address] ([facility]) [address], [place] [address] You do not need an appointment.  Just tell them "I'm here for labwork." They are open from 8 am- 4:30 pm.  Lunch from 1:00 pm- 2:00 pm You DO  need to be fasting.  Anlisis de laboratorio: Su mdico le pedir que regrese en 2 o 3 semanas para realizarse los siguientes anlisis: perfil lipdico y prueba de funcin heptica.   Por favor, dirjase a Nurse, learning disability  [address] ([address], [place] [address]  No necesita cita.  Simplemente dgales: "Estoy aqu para anlisis de laboratorio".  Horario de atencin: de 8:00 a. m. a 4:30 p. m.  Almuerzo: de 1:00 p. m. a 2:00 p. m.  Debe estar en ayunas.  El tcnico de laboratorio necesitar su nombre y fecha de nacimiento.   Follow-Up: At [facility], you and your health needs are our priority.  As part of our continuing mission to provide you with exceptional heart care, we have created designated Provider Care Teams.  These Care Teams include your primary Cardiologist (physician) and Advanced Practice Providers (APPs -  Physician Assistants and Nurse Practitioners) who all work together to provide you with the care you need, when you need it.  We recommend signing up for the patient portal called "MyChart".  Sign up information is provided on this After Visit Summary.  MyChart is used to connect with patients for Virtual Visits (Telemedicine).  Patients are able to view lab/test results, encounter notes, upcoming appointments, etc.  Non-urgent  messages can be sent to your provider as well.   To learn more about what you can do with MyChart, go to [email or web address].    Your next appointment:   6 month(s)  Provider:   You may see Yvonne Kendall, MD or Carlos Levering, NP

## 2024-01-23 ENCOUNTER — Other Ambulatory Visit: Payer: Self-pay | Admitting: Emergency Medicine

## 2024-01-23 DIAGNOSIS — Z79899 Other long term (current) drug therapy: Secondary | ICD-10-CM

## 2024-01-23 DIAGNOSIS — E785 Hyperlipidemia, unspecified: Secondary | ICD-10-CM

## 2024-01-23 LAB — HEPATIC FUNCTION PANEL
ALT: 19 IU/L (ref 0–32)
AST: 17 IU/L (ref 0–40)
Albumin: 4.3 g/dL (ref 3.8–4.9)
Alkaline Phosphatase: 118 IU/L (ref 44–121)
Bilirubin Total: 0.4 mg/dL (ref 0.0–1.2)
Bilirubin, Direct: 0.12 mg/dL (ref 0.00–0.40)
Total Protein: 7.2 g/dL (ref 6.0–8.5)

## 2024-01-23 LAB — LIPID PANEL
Chol/HDL Ratio: 4.8 ratio — ABNORMAL HIGH (ref 0.0–4.4)
Cholesterol, Total: 214 mg/dL — ABNORMAL HIGH (ref 100–199)
HDL: 45 mg/dL (ref >39)
LDL Chol Calc (NIH): 124 mg/dL — ABNORMAL HIGH (ref 0–99)
Triglycerides: 255 mg/dL — ABNORMAL HIGH (ref 0–149)
VLDL Cholesterol Cal: 45 mg/dL — ABNORMAL HIGH (ref 5–40)

## 2024-01-23 MED ORDER — ATORVASTATIN CALCIUM 40 MG PO TABS: 40.0000 mg | ORAL_TABLET | Freq: Every day | ORAL | 11 refills | Status: AC

## 2024-06-25 ENCOUNTER — Encounter: Payer: Self-pay | Admitting: *Deleted

## 2024-06-27 NOTE — Progress Notes (Unsigned)
 Cardiology Clinic Note   Date: 06/30/2024 ID: Shalawn Vergara Fernandez, DOB 05/13/1966, MRN [id number]  Primary Cardiologist:  Lonni Hanson, MD  Chief Complaint   Elaine Fernandez is a 58 y.o. female who presents to the clinic today for routine follow up.   Patient Profile   Elaine Fernandez is followed by Dr. Hanson for the history outlined below.      Past medical history significant for: CAD/SCAD. LHC 07/10/2018 (NSTEMI): Significant stenosis (90 to 99%) involving small branch of D1 and OM 3 which likely represent culprit lesions for the patient's NSTEMI.  Large epicardial coronary arteries are angiographically normal.  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  Focal mid inferior hypokinesis with otherwise preserved LV systolic function.  No indication for PCI given small vessel/lack of ongoing symptoms. Consider renal artery ultrasound to exclude fibromuscular dysplasia. Renal artery ultrasound 08/27/2018: Normal size, resistive index, cortical thickness and renal arteries bilaterally.  Normal celiac and superior mesenteric arteries. Echo 12/08/2018: EF 60-65%.  No RWMA.  Normal diastolic parameters.  Normal RV size/function.  No significant valvular abnormalities. Cardiac PET/CT 12/12/2023: Normal LV perfusion.  No evidence of ischemia.  No evidence of infarction.  Normal, low risk study. Hyperlipidemia. Lipid panel 01/22/2024: LDL 124, HDL 45, TG 255, total 214. Gastritis. T2DM.   In summary, patient has a history of CAD.  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  Echo at that time showed EF 55 to 60%, RWMA could not be excluded, Grade I DD, normal RV size/function, mild LAE, no significant valvular abnormalities.  She underwent renal artery ultrasound to exclude fibromuscular dysplasia which was normal.  Repeat echo March 2020 showed EF 60 to 65% as detailed above.  In February 2021, patient  presented to the ED for chest pain and shortness of breath that developed after finding out her brother had passed away from COVID.  Symptoms resolved after SL NTG x 1.  EKG shows sinus rhythm with no significant abnormality (baseline wander limited evaluation).  Troponin negative.  She followed up in the office and reported continued intermittent mild central chest discomfort.  It was recommended she presents to the ED via she declined and agreed to starting isosorbide .  She did not want to retrial beta-blocker as she felt fatigued and shortness of breath with taking it in the past.  In August 2021 patient reported isosorbide  discontinued by PCP secondary to lightheadedness.  She reported this did not help her lightheadedness but declined restarting isosorbide .  Patient was seen in the office on 04/13/2022 with complaints of chest pain.  Patient was found to have tenderness to palpation on the left side of her chest.  She reported working at [employer] and doing a lot of of lifting of heavy boxes.  It was recommended she restart isosorbide .   Patient was last seen in the office on 11/11/2023 for overdue follow-up.  She reported episodes of chest pressure and shortness of breath that began in October 2024.  She describes central chest discomfort with radiation down left arm that typically started in the afternoon or evening after eating and continued through the night.  Pain was worsened with lying on her left side and relieved with lying on her right side and occurred 3-4 times a week.  It was unclear if the pain she was experiencing was similar to pain in the past.  She reported not taking isosorbide  x 1 year and atorvastatin   x 7 months.  She was restarted on isosorbide  and atorvastatin  and PET stress was ordered.  She underwent PET stress in March 2025 which was a normal, low risk study.  Upon follow up in March she reported resolution of chest pain. She was not taking isosorbide  secondary to dizziness. She  was working on losing weight and increasing exercises. No medication changes were made.      History of Present Illness    Today, patient is here alone. Communication is assisted by in-person [facility] interpreter, Alan. Patient denies chest pain, pressure or tightness. No lower extremity edema or orthopnea. She is experiencing DOE over the last couple of months. Walking and performing body weight exercises cause her to become dyspneic. She also reports PND often waking in the middle of the night feeling like she cannot get in a good breath. Her husband also told her that he will sometimes hear her gasping for breath in the middle of the night. She believes she snores at night as well. She reports daytime somnolence. She does not have a home BP cuff but plans on buying one today.     ROS: All other systems reviewed and are otherwise negative except as noted in History of Present Illness.  EKGs/Labs Reviewed    EKG Interpretation Date/Time:  Tuesday June 30 2024 11:37:01 EDT Ventricular Rate:  70 PR Interval:  172 QRS Duration:  82 QT Interval:  386 QTC Calculation: 416 R Axis:   22  Text Interpretation: Normal sinus rhythm Normal ECG When compared with ECG of 11-Nov-2023 14:53, No significant change was found Confirmed by Loistine Sober [phone]) on 06/30/2024 11:42:11 AM   01/22/2024: ALT 19; AST 17    Risk Assessment/Calculations        STOP-Bang Score:  5       Physical Exam    VS:  BP 132/80 (BP Location: Left Arm, Patient Position: Sitting, Cuff Size: Normal)   Pulse 70   Ht 5' 5 (1.651 m)   Wt 177 lb 12.8 oz (80.6 kg)   SpO2 96%   BMI 29.59 kg/m  , BMI Body mass index is 29.59 kg/m.  GEN: Well nourished, well developed, in no acute distress. Neck: No JVD or carotid bruits. Cardiac:  RRR.  No murmur. No rubs or gallops.   Respiratory:  Respirations regular and unlabored. Clear to auscultation without rales, wheezing or rhonchi. GI: Soft, nontender,  nondistended. Extremities: Radials/DP/PT 2+ and equal bilaterally. No clubbing or cyanosis. No edema.  Skin: Warm and dry, no rash. Neuro: Strength intact.  Assessment & Plan   CAD/DOE [facility] October 2019 in the setting of STEMI showed significant stenosis and was in D1 and OM3 with no PCI recommended secondary to small vessels, findings and history suggestive of multifocal spontaneous coronary dissection.  Echo March 2020 showed normal LV/RV function, normal diastolic parameters, no significant valvular abnormalities.  Cardiac PET/CT 12/12/2023 showed normal LV perfusion, no evidence of ischemia/infarction, normal, low risk study.  Patient denies chest pain. She is experiencing DOE over the last couple of months particularly with walking and body weight exercises.  - Schedule echo.  - Continue aspirin , atorvastatin .   Hyperlipidemia LDL 124 April 2025, not at goal.  At the time of lipid panel in April patient was out of atorvastatin .  Dose was increased to 80 mg daily.  - Continue atorvastatin . - CMP, lipid panel, direct LDL today.   Sleep disordered breathing Patient reports waking in the middle of the night feeling as though  she cannot get in a good breath. Husband has told her that she will sometimes gasp for breath in the middle of the night. She endorses daytime somnolence. Stop Bang score 5.  - Refer to pulmonology.   Disposition: CMP, lipid panel, direct LDL today. Echo. Refer to pulmonology. Return in 6 months or sooner as needed.          Signed, Barnie HERO. Donyelle Enyeart, DNP, NP-C

## 2024-06-30 ENCOUNTER — Encounter: Payer: Self-pay | Admitting: Student

## 2024-06-30 ENCOUNTER — Ambulatory Visit: Payer: Self-pay | Attending: Student | Admitting: Student

## 2024-06-30 VITALS — BP 132/80 | HR 70 | Ht 65.0 in | Wt 177.8 lb

## 2024-06-30 DIAGNOSIS — G4730 Sleep apnea, unspecified: Secondary | ICD-10-CM

## 2024-06-30 DIAGNOSIS — Z79899 Other long term (current) drug therapy: Secondary | ICD-10-CM

## 2024-06-30 DIAGNOSIS — R0609 Other forms of dyspnea: Secondary | ICD-10-CM

## 2024-06-30 DIAGNOSIS — E785 Hyperlipidemia, unspecified: Secondary | ICD-10-CM

## 2024-06-30 DIAGNOSIS — I2510 Atherosclerotic heart disease of native coronary artery without angina pectoris: Secondary | ICD-10-CM

## 2024-06-30 NOTE — Patient Instructions (Signed)
 Medication Instructions:  Your physician recommends that you continue on your current medications as directed. Please refer to the Current Medication list given to you today.   *If you need a refill on your cardiac medications before your next appointment, please call your pharmacy*  Lab Work: Your provider would like for you to have following labs drawn today CMP, Lipid Panel, Direct LDL.   If you have labs (blood work) drawn today and your tests are completely normal, you will receive your results only by: MyChart Message (if you have MyChart) OR A paper copy in the mail If you have any lab test that is abnormal or we need to change your treatment, we will call you to review the results.  Testing/Procedures: Your physician has requested that you have an Echocardiogram. Echocardiography is a painless test that uses sound waves to create images of your heart. It provides your doctor with information about the size and shape of your heart and how well your heart's chambers and valves are working.   You may receive an ultrasound enhancing agent through an IV if needed to better visualize your heart during the echo. This procedure takes approximately one hour.  There are no restrictions for this procedure.  This will take place at [address] [facility]) [address], [place] [address]  Please note: We ask at that you not bring children with you during ultrasound (echo/ vascular) testing. Due to room size and safety concerns, children are not allowed in the ultrasound rooms during exams. Our front office staff cannot provide observation of children in our lobby area while testing is being conducted. An adult accompanying a patient to their appointment will only be allowed in the ultrasound room at the discretion of the ultrasound technician under special circumstances. We apologize for any inconvenience.   Your cardiologist has referred you to Pulmonology   We have attached their office  location and phone number below.  Please allow them 3-5 business days to reach out to you to make an appointment.  If you have not heard from their office within that time, please call them to schedule your appointment.    Obtain an automatic Blood Pressure Cuff.  Take blood pressures at home.  Your goal blood pressure is less than 130/80.  Avoid alcohol, caffeine, and exercise within 30 minutes before checking blood pressure. Sit still, with feet flat on the floor, in a straight backed, supportive chair.  Rest for 5 minutes.  Support arm on flat surface at heart level. Bottom of cuff should be placed directly above the bend of the elbow. Take multiple readings.  Write down and bring to all follow up appointments.  Bring cuff to clinic periodically for accuracy comparison.   Follow-Up: At [facility], you and your health needs are our priority.  As part of our continuing mission to provide you with exceptional heart care, our providers are all part of one team.  This team includes your primary Cardiologist (physician) and Advanced Practice Providers or APPs (Physician Assistants and Nurse Practitioners) who all work together to provide you with the care you need, when you need it.  Your next appointment:   6 month(s)  Provider:   Lonni Hanson, MD or Barnie Hila, NP    We recommend signing up for the patient portal called MyChart.  Sign up information is provided on this After Visit Summary.  MyChart is used to connect with patients for Virtual Visits (Telemedicine).  Patients are able to  view lab/test results, encounter notes, upcoming appointments, etc.  Non-urgent messages can be sent to your provider as well.   To learn more about what you can do with MyChart, go to [email or web address].

## 2024-07-01 ENCOUNTER — Ambulatory Visit: Payer: Self-pay | Admitting: Student

## 2024-07-01 ENCOUNTER — Other Ambulatory Visit: Payer: Self-pay | Admitting: Emergency Medicine

## 2024-07-01 DIAGNOSIS — Z79899 Other long term (current) drug therapy: Secondary | ICD-10-CM

## 2024-07-01 LAB — COMPREHENSIVE METABOLIC PANEL WITH GFR
ALT: 14 IU/L (ref 0–32)
AST: 11 IU/L (ref 0–40)
Albumin: 4.2 g/dL (ref 3.8–4.9)
Alkaline Phosphatase: 124 IU/L (ref 49–135)
BUN/Creatinine Ratio: 27 — ABNORMAL HIGH (ref 9–23)
BUN: 17 mg/dL (ref 6–24)
Bilirubin Total: 0.4 mg/dL (ref 0.0–1.2)
CO2: 22 mmol/L (ref 20–29)
Calcium: 9.2 mg/dL (ref 8.7–10.2)
Chloride: 105 mmol/L (ref 96–106)
Creatinine, Ser: 0.63 mg/dL (ref 0.57–1.00)
Globulin, Total: 2.8 g/dL (ref 1.5–4.5)
Glucose: 90 mg/dL (ref 70–99)
Potassium: 4.3 mmol/L (ref 3.5–5.2)
Sodium: 143 mmol/L (ref 134–144)
Total Protein: 7 g/dL (ref 6.0–8.5)
eGFR: 104 mL/min/1.73 (ref >59)

## 2024-07-01 LAB — LIPID PANEL
Chol/HDL Ratio: 4.2 ratio (ref 0.0–4.4)
Cholesterol, Total: 191 mg/dL (ref 100–199)
HDL: 45 mg/dL (ref >39)
LDL Chol Calc (NIH): 86 mg/dL (ref 0–99)
Triglycerides: 369 mg/dL — ABNORMAL HIGH (ref 0–149)
VLDL Cholesterol Cal: 60 mg/dL — ABNORMAL HIGH (ref 5–40)

## 2024-07-01 LAB — LDL CHOLESTEROL, DIRECT: LDL Direct: 107 mg/dL — ABNORMAL HIGH (ref 0–99)

## 2024-07-01 NOTE — Progress Notes (Signed)
 Patient called again to inform her of the order put in (per Barnie Hila, NP)  for a fasting Lipid Panel to be drawn in about 2 months. Patient verbalized understanding with all questions and concerns addressed at this time.

## 2024-07-01 NOTE — Progress Notes (Signed)
 Patient called using the Interpre[employer]phone Service, interpreter Daniella, ID#: [phone].  Patient informed of the lab results and inquired about taking her medications regularly.  Patient states that she sometimes misses doses.  Patient advised to take medications without missing doses, and add a recurring telephone reminder to ensure that the medication is taken everyday.  Patient acknowledge the need to be more regular with taking the medication stating that she will get her son to put that reminder in her phone.  Patient expressed gratitude for the phone call.  Patient was advised that the information she gave will be relayed to Barnie Hila, NP for review.  Patient verbalized understanding with all questions and concerns addressed at this time.

## 2024-07-06 ENCOUNTER — Encounter: Payer: Self-pay | Admitting: Sleep Medicine

## 2024-07-06 ENCOUNTER — Ambulatory Visit: Payer: Self-pay | Admitting: Sleep Medicine

## 2024-07-06 VITALS — BP 106/60 | HR 69 | Temp 98.3°F | Ht 65.0 in | Wt 180.0 lb

## 2024-07-06 DIAGNOSIS — I2510 Atherosclerotic heart disease of native coronary artery without angina pectoris: Secondary | ICD-10-CM

## 2024-07-06 DIAGNOSIS — G4733 Obstructive sleep apnea (adult) (pediatric): Secondary | ICD-10-CM

## 2024-07-06 NOTE — Patient Instructions (Signed)
 SABRA

## 2024-07-06 NOTE — Progress Notes (Signed)
 Name:Elaine Fernandez MRN: [id number] DOB: January 10, 1966   CHIEF COMPLAINT:  EXCESSIVE DAYTIME SLEEPINESS   HISTORY OF PRESENT ILLNESS: Elaine Fernandez is a 58 y.o. w/ a h/o CAD, DMII and hyperlipidemia who present for c/o loud snoring, witnessed apnea and excessive daytime sleepiness which has been present for several years. Reports nocturnal awakenings due to unclear reasons and has difficulty falling back to sleep. Reports a 20 lb weight loss over the last few months. Admits to headaches, nights sweats and dry mouth. Denies RLS symptoms, dream enactment, cataplexy, hypnagogic or hypnapompic hallucinations. Denies a family history of sleep apnea. Denies drowsy driving. Drinks coffee and tea occasionally, occasional alcohol use, denies tobacco or illicit drug use.   Bedtime 11 pm or 12 am Sleep onset 10 mins Rise time 8-9 am   EPWORTH SLEEP SCORE 10    07/06/2024   10:00 AM  Results of the Epworth flowsheet  Sitting and reading 1  Watching TV 0  Sitting, inactive in a public place (e.g. a theatre or a meeting) 2  As a passenger in a car for an hour without a break 2  Lying down to rest in the afternoon when circumstances permit 2  Sitting and talking to someone 0  Sitting quietly after a lunch without alcohol 2  In a car, while stopped for a few minutes in traffic 1  Total score 10    PAST MEDICAL HISTORY :   has a past medical history of Diabetes mellitus without complication (HCC), Diastolic dysfunction, Dysmenorrhea, Hyperlipidemia LDL goal <70, Menorrhagia, and Spontaneous dissection of coronary artery.  has a past surgical history that includes Cholecystectomy; Hernia repair; Abdominal hysterectomy; and LEFT HEART CATH AND CORONARY ANGIOGRAPHY (N/A, 08/06/2018). Prior to Admission medications   Medication Sig Start Date End Date Taking? Authorizing Provider  aspirin  EC 81 MG tablet Take 81 mg by mouth daily. 11/09/22  Yes [provider]  atorvastatin   (LIPITOR) 40 MG tablet Take 1 tablet (40 mg total) by mouth daily at 6 PM. 01/23/24  Yes Wittenborn, Barnie, NP  metformin (FORTAMET) 500 MG (OSM) 24 hr tablet Take 500 mg by mouth 2 (two) times daily with a meal.   Yes [provider]  nitroGLYCERIN  (NITROSTAT ) 0.4 MG SL tablet Place 1 tablet (0.4 mg total) under the tongue every 5 (five) minutes as needed for chest pain. 08/08/18  Yes Judd Betters, MD  metroNIDAZOLE (FLAGYL) 500 MG tablet Take 500 mg by mouth 2 (two) times daily. Patient not taking: Reported on 07/06/2024 12/31/23   [provider]  sulfamethoxazole-trimethoprim (BACTRIM DS) 800-160 MG tablet Take 1 tablet by mouth 2 (two) times daily. Patient not taking: Reported on 07/06/2024 12/31/23   [provider]   Allergies  Allergen Reactions   Amoxicillin Swelling    FAMILY HISTORY:  family history includes CAD in her father and mother; Diabetes in her father and mother; Heart attack in her father and mother. SOCIAL HISTORY:  reports that she has never smoked. She has never used smokeless tobacco. She reports that she does not drink alcohol and does not use drugs.   Review of Systems:  Gen:  Denies  fever, sweats, chills weight loss  HEENT: Denies blurred vision, double vision, ear pain, eye pain, hearing loss, nose bleeds, sore throat Cardiac:  No dizziness, chest pain or heaviness, chest tightness,edema, No JVD Resp:   No cough, -sputum production, -shortness of breath,-wheezing, -hemoptysis,  Gi: Denies swallowing difficulty, stomach pain, nausea  or vomiting, diarrhea, constipation, bowel incontinence Gu:  Denies bladder incontinence, burning urine Ext:   Denies Joint pain, stiffness or swelling Skin: Denies  skin rash, easy bruising or bleeding or hives Endoc:  Denies polyuria, polydipsia , polyphagia or weight change Psych:   Denies depression, insomnia or hallucinations  Other:  All other systems negative  VITAL SIGNS: BP 106/60   Pulse  69   Temp 98.3 F (36.8 C)   Ht 5' 5 (1.651 m)   Wt 180 lb (81.6 kg)   SpO2 98%   BMI 29.95 kg/m    Physical Examination:   General Appearance: No distress  EYES PERRLA, EOM intact.   NECK Supple, No JVD Pulmonary: normal breath sounds, No wheezing.  CardiovascularNormal S1,S2.  No m/r/g.   Abdomen: Benign, Soft, non-tender. Skin:   warm, no rashes, no ecchymosis  Extremities: normal, no cyanosis, clubbing. Neuro:without focal findings,  speech normal  PSYCHIATRIC: Mood, affect within normal limits.   ASSESSMENT AND PLAN  OSA I suspect that OSA is likely present due to clinical presentation. Discussed the consequences of untreated sleep apnea. Advised not to drive drowsy for safety of patient and others. Will complete further evaluation with a home sleep study and follow up to review results.    CAD Stable, on current management. Following with cardiology.    MEDICATION ADJUSTMENTS/LABS AND TESTS ORDERED: Recommend Sleep Study   Patient  satisfied with Plan of action and management. All questions answered  Follow up to review HST results and treatment plan.   I spent a total of 32 minutes reviewing chart data, face-to-face evaluation with the patient, counseling and coordination of care as detailed above.    Lindey Renzulli, M.D.  Sleep Medicine  Pulmonary & Critical Care Medicine

## 2024-08-24 ENCOUNTER — Ambulatory Visit: Payer: Self-pay | Attending: Student

## 2024-08-24 DIAGNOSIS — R0609 Other forms of dyspnea: Secondary | ICD-10-CM

## 2024-08-24 LAB — ECHOCARDIOGRAM COMPLETE
AR max vel: 2.61 cm2
AV Area VTI: 2.7 cm2
AV Area mean vel: 2.57 cm2
AV Mean grad: 4 mmHg
AV Peak grad: 6.7 mmHg
Ao pk vel: 1.29 m/s
Area-P 1/2: 3.93 cm2
S' Lateral: 2.51 cm

## 2024-09-23 DIAGNOSIS — R069 Unspecified abnormalities of breathing: Secondary | ICD-10-CM

## 2024-09-23 DIAGNOSIS — G4733 Obstructive sleep apnea (adult) (pediatric): Secondary | ICD-10-CM

## 2024-10-07 ENCOUNTER — Ambulatory Visit: Payer: Self-pay

## 2024-12-28 ENCOUNTER — Ambulatory Visit: Payer: Self-pay | Admitting: Student
